# Patient Record
Sex: Male | Born: 1965 | Hispanic: No | Smoking: Current every day smoker
Health system: Southern US, Community
[De-identification: ages and names within clinical notes are randomized; demographics above are authoritative.]

## PROBLEM LIST (undated history)

## (undated) DIAGNOSIS — Z9989 Dependence on other enabling machines and devices: Secondary | ICD-10-CM

## (undated) DIAGNOSIS — M1990 Unspecified osteoarthritis, unspecified site: Secondary | ICD-10-CM

## (undated) DIAGNOSIS — K219 Gastro-esophageal reflux disease without esophagitis: Secondary | ICD-10-CM

## (undated) DIAGNOSIS — J45909 Unspecified asthma, uncomplicated: Secondary | ICD-10-CM

## (undated) DIAGNOSIS — E785 Hyperlipidemia, unspecified: Secondary | ICD-10-CM

## (undated) DIAGNOSIS — I10 Essential (primary) hypertension: Secondary | ICD-10-CM

## (undated) DIAGNOSIS — G4733 Obstructive sleep apnea (adult) (pediatric): Secondary | ICD-10-CM

## (undated) DIAGNOSIS — G8220 Paraplegia, unspecified: Secondary | ICD-10-CM

## (undated) DIAGNOSIS — F419 Anxiety disorder, unspecified: Secondary | ICD-10-CM

## (undated) HISTORY — PX: OTHER SURGICAL HISTORY: SHX169

## (undated) HISTORY — DX: Dependence on other enabling machines and devices: Z99.89

## (undated) HISTORY — PX: MULTIPLE TOOTH EXTRACTIONS: SHX2053

## (undated) HISTORY — DX: Hyperlipidemia, unspecified: E78.5

## (undated) HISTORY — PX: WISDOM TOOTH EXTRACTION: SHX21

## (undated) HISTORY — DX: Unspecified asthma, uncomplicated: J45.909

## (undated) HISTORY — DX: Unspecified osteoarthritis, unspecified site: M19.90

## (undated) HISTORY — PX: ABDOMINAL SURGERY: SHX537

## (undated) HISTORY — DX: Obstructive sleep apnea (adult) (pediatric): G47.33

## (undated) HISTORY — DX: Gastro-esophageal reflux disease without esophagitis: K21.9

## (undated) HISTORY — PX: TONSILLECTOMY: SUR1361

## (undated) HISTORY — DX: Anxiety disorder, unspecified: F41.9

---

## 1997-12-14 ENCOUNTER — Emergency Department (HOSPITAL_COMMUNITY): Admission: EM | Admit: 1997-12-14 | Discharge: 1997-12-14 | Payer: Self-pay | Admitting: Emergency Medicine

## 1998-01-04 ENCOUNTER — Emergency Department (HOSPITAL_COMMUNITY): Admission: EM | Admit: 1998-01-04 | Discharge: 1998-01-04 | Payer: Self-pay | Admitting: Cardiology

## 1998-02-14 ENCOUNTER — Emergency Department (HOSPITAL_COMMUNITY): Admission: EM | Admit: 1998-02-14 | Discharge: 1998-02-14 | Payer: Self-pay | Admitting: Emergency Medicine

## 1998-09-17 ENCOUNTER — Encounter: Payer: Self-pay | Admitting: Emergency Medicine

## 1998-09-17 ENCOUNTER — Emergency Department (HOSPITAL_COMMUNITY): Admission: EM | Admit: 1998-09-17 | Discharge: 1998-09-17 | Payer: Self-pay | Admitting: Emergency Medicine

## 1998-09-24 ENCOUNTER — Emergency Department (HOSPITAL_COMMUNITY): Admission: EM | Admit: 1998-09-24 | Discharge: 1998-09-24 | Payer: Self-pay | Admitting: Emergency Medicine

## 1998-11-12 ENCOUNTER — Inpatient Hospital Stay (HOSPITAL_COMMUNITY): Admission: EM | Admit: 1998-11-12 | Discharge: 1998-11-14 | Payer: Self-pay | Admitting: Emergency Medicine

## 1998-11-17 ENCOUNTER — Encounter: Admission: RE | Admit: 1998-11-17 | Discharge: 1998-11-17 | Payer: Self-pay | Admitting: Family Medicine

## 1999-04-12 ENCOUNTER — Emergency Department (HOSPITAL_COMMUNITY): Admission: EM | Admit: 1999-04-12 | Discharge: 1999-04-12 | Payer: Self-pay | Admitting: Emergency Medicine

## 1999-04-12 ENCOUNTER — Encounter: Payer: Self-pay | Admitting: Emergency Medicine

## 1999-05-03 ENCOUNTER — Inpatient Hospital Stay (HOSPITAL_COMMUNITY): Admission: EM | Admit: 1999-05-03 | Discharge: 1999-05-10 | Payer: Self-pay | Admitting: Emergency Medicine

## 1999-05-03 ENCOUNTER — Encounter: Payer: Self-pay | Admitting: Emergency Medicine

## 1999-05-10 ENCOUNTER — Emergency Department (HOSPITAL_COMMUNITY): Admission: EM | Admit: 1999-05-10 | Discharge: 1999-05-10 | Payer: Self-pay | Admitting: Emergency Medicine

## 1999-06-19 ENCOUNTER — Inpatient Hospital Stay (HOSPITAL_COMMUNITY): Admission: EM | Admit: 1999-06-19 | Discharge: 1999-06-21 | Payer: Self-pay | Admitting: Emergency Medicine

## 1999-06-19 ENCOUNTER — Encounter: Payer: Self-pay | Admitting: Emergency Medicine

## 1999-06-19 ENCOUNTER — Encounter: Payer: Self-pay | Admitting: Internal Medicine

## 1999-06-20 ENCOUNTER — Encounter: Payer: Self-pay | Admitting: Internal Medicine

## 1999-06-21 ENCOUNTER — Encounter: Payer: Self-pay | Admitting: *Deleted

## 2002-03-21 ENCOUNTER — Encounter: Payer: Self-pay | Admitting: Emergency Medicine

## 2002-03-21 ENCOUNTER — Emergency Department (HOSPITAL_COMMUNITY): Admission: EM | Admit: 2002-03-21 | Discharge: 2002-03-21 | Payer: Self-pay | Admitting: Emergency Medicine

## 2002-04-11 ENCOUNTER — Encounter: Payer: Self-pay | Admitting: Orthopedic Surgery

## 2002-04-11 ENCOUNTER — Ambulatory Visit (HOSPITAL_COMMUNITY): Admission: RE | Admit: 2002-04-11 | Discharge: 2002-04-11 | Payer: Self-pay | Admitting: Orthopedic Surgery

## 2003-07-05 ENCOUNTER — Emergency Department (HOSPITAL_COMMUNITY): Admission: EM | Admit: 2003-07-05 | Discharge: 2003-07-05 | Payer: Self-pay

## 2003-07-24 ENCOUNTER — Inpatient Hospital Stay (HOSPITAL_COMMUNITY): Admission: EM | Admit: 2003-07-24 | Discharge: 2003-07-29 | Payer: Self-pay | Admitting: Emergency Medicine

## 2005-05-06 ENCOUNTER — Inpatient Hospital Stay (HOSPITAL_COMMUNITY): Admission: EM | Admit: 2005-05-06 | Discharge: 2005-05-16 | Payer: Self-pay | Admitting: Emergency Medicine

## 2005-05-06 ENCOUNTER — Ambulatory Visit: Payer: Self-pay | Admitting: Physical Medicine & Rehabilitation

## 2005-05-16 ENCOUNTER — Emergency Department (HOSPITAL_COMMUNITY): Admission: EM | Admit: 2005-05-16 | Discharge: 2005-05-16 | Payer: Self-pay | Admitting: Emergency Medicine

## 2005-05-18 ENCOUNTER — Ambulatory Visit: Payer: Self-pay | Admitting: Family Medicine

## 2005-05-29 ENCOUNTER — Ambulatory Visit: Payer: Self-pay | Admitting: *Deleted

## 2005-06-07 ENCOUNTER — Ambulatory Visit: Payer: Self-pay | Admitting: Family Medicine

## 2005-07-05 ENCOUNTER — Ambulatory Visit: Payer: Self-pay | Admitting: Family Medicine

## 2005-07-20 ENCOUNTER — Ambulatory Visit: Payer: Self-pay | Admitting: Family Medicine

## 2005-08-01 ENCOUNTER — Encounter: Admission: RE | Admit: 2005-08-01 | Discharge: 2005-10-30 | Payer: Self-pay | Admitting: Family Medicine

## 2005-08-03 ENCOUNTER — Ambulatory Visit: Payer: Self-pay | Admitting: Family Medicine

## 2005-08-30 ENCOUNTER — Emergency Department (HOSPITAL_COMMUNITY): Admission: EM | Admit: 2005-08-30 | Discharge: 2005-08-31 | Payer: Self-pay | Admitting: Emergency Medicine

## 2005-09-02 ENCOUNTER — Ambulatory Visit: Payer: Self-pay | Admitting: Family Medicine

## 2005-09-15 ENCOUNTER — Ambulatory Visit: Payer: Self-pay | Admitting: Family Medicine

## 2005-10-28 ENCOUNTER — Ambulatory Visit: Payer: Self-pay | Admitting: Family Medicine

## 2005-11-09 ENCOUNTER — Ambulatory Visit: Payer: Self-pay | Admitting: Family Medicine

## 2005-12-14 ENCOUNTER — Ambulatory Visit: Payer: Self-pay | Admitting: Family Medicine

## 2005-12-16 ENCOUNTER — Ambulatory Visit: Payer: Self-pay | Admitting: Family Medicine

## 2005-12-20 ENCOUNTER — Emergency Department (HOSPITAL_COMMUNITY): Admission: EM | Admit: 2005-12-20 | Discharge: 2005-12-20 | Payer: Self-pay | Admitting: Emergency Medicine

## 2005-12-29 ENCOUNTER — Emergency Department (HOSPITAL_COMMUNITY): Admission: EM | Admit: 2005-12-29 | Discharge: 2005-12-29 | Payer: Self-pay | Admitting: Emergency Medicine

## 2006-01-02 ENCOUNTER — Ambulatory Visit: Payer: Self-pay | Admitting: Family Medicine

## 2006-02-09 ENCOUNTER — Ambulatory Visit: Payer: Self-pay | Admitting: Internal Medicine

## 2006-02-13 ENCOUNTER — Ambulatory Visit: Payer: Self-pay | Admitting: Family Medicine

## 2006-02-22 ENCOUNTER — Ambulatory Visit: Payer: Self-pay | Admitting: Family Medicine

## 2006-03-21 ENCOUNTER — Encounter: Admission: RE | Admit: 2006-03-21 | Discharge: 2006-06-19 | Payer: Self-pay | Admitting: Family Medicine

## 2006-04-26 ENCOUNTER — Ambulatory Visit: Payer: Self-pay | Admitting: Family Medicine

## 2006-04-27 ENCOUNTER — Emergency Department (HOSPITAL_COMMUNITY): Admission: EM | Admit: 2006-04-27 | Discharge: 2006-04-27 | Payer: Self-pay | Admitting: Emergency Medicine

## 2006-05-11 ENCOUNTER — Ambulatory Visit: Payer: Self-pay | Admitting: Family Medicine

## 2006-06-08 ENCOUNTER — Ambulatory Visit: Payer: Self-pay | Admitting: Family Medicine

## 2006-06-30 ENCOUNTER — Ambulatory Visit: Payer: Self-pay | Admitting: Family Medicine

## 2006-07-03 ENCOUNTER — Ambulatory Visit: Payer: Self-pay | Admitting: Internal Medicine

## 2006-07-05 ENCOUNTER — Inpatient Hospital Stay (HOSPITAL_COMMUNITY): Admission: EM | Admit: 2006-07-05 | Discharge: 2006-07-13 | Payer: Self-pay | Admitting: Emergency Medicine

## 2006-07-18 ENCOUNTER — Ambulatory Visit: Payer: Self-pay | Admitting: Family Medicine

## 2006-08-23 ENCOUNTER — Ambulatory Visit: Payer: Self-pay | Admitting: Family Medicine

## 2006-10-25 ENCOUNTER — Ambulatory Visit: Payer: Self-pay | Admitting: Family Medicine

## 2006-11-23 ENCOUNTER — Ambulatory Visit: Payer: Self-pay | Admitting: Family Medicine

## 2006-12-21 ENCOUNTER — Ambulatory Visit: Payer: Self-pay | Admitting: Family Medicine

## 2007-01-04 ENCOUNTER — Ambulatory Visit: Payer: Self-pay | Admitting: Family Medicine

## 2007-01-17 ENCOUNTER — Ambulatory Visit: Payer: Self-pay | Admitting: Family Medicine

## 2007-02-03 DIAGNOSIS — M6281 Muscle weakness (generalized): Secondary | ICD-10-CM | POA: Insufficient documentation

## 2007-02-03 DIAGNOSIS — Z87828 Personal history of other (healed) physical injury and trauma: Secondary | ICD-10-CM | POA: Insufficient documentation

## 2007-02-03 DIAGNOSIS — I10 Essential (primary) hypertension: Secondary | ICD-10-CM | POA: Insufficient documentation

## 2007-02-03 DIAGNOSIS — K219 Gastro-esophageal reflux disease without esophagitis: Secondary | ICD-10-CM | POA: Insufficient documentation

## 2007-02-03 DIAGNOSIS — B957 Other staphylococcus as the cause of diseases classified elsewhere: Secondary | ICD-10-CM | POA: Insufficient documentation

## 2007-02-14 DIAGNOSIS — F17210 Nicotine dependence, cigarettes, uncomplicated: Secondary | ICD-10-CM | POA: Insufficient documentation

## 2007-02-14 DIAGNOSIS — G894 Chronic pain syndrome: Secondary | ICD-10-CM | POA: Insufficient documentation

## 2007-02-14 DIAGNOSIS — F1910 Other psychoactive substance abuse, uncomplicated: Secondary | ICD-10-CM | POA: Insufficient documentation

## 2007-02-19 ENCOUNTER — Ambulatory Visit: Payer: Self-pay | Admitting: Family Medicine

## 2007-02-21 ENCOUNTER — Ambulatory Visit (HOSPITAL_COMMUNITY): Admission: RE | Admit: 2007-02-21 | Discharge: 2007-02-21 | Payer: Self-pay | Admitting: Family Medicine

## 2007-02-27 ENCOUNTER — Ambulatory Visit: Payer: Self-pay | Admitting: Family Medicine

## 2007-03-08 ENCOUNTER — Ambulatory Visit: Payer: Self-pay | Admitting: Family Medicine

## 2007-07-16 ENCOUNTER — Emergency Department (HOSPITAL_COMMUNITY): Admission: EM | Admit: 2007-07-16 | Discharge: 2007-07-16 | Payer: Self-pay | Admitting: Emergency Medicine

## 2007-12-17 ENCOUNTER — Ambulatory Visit: Payer: Self-pay | Admitting: Internal Medicine

## 2007-12-17 ENCOUNTER — Encounter (INDEPENDENT_AMBULATORY_CARE_PROVIDER_SITE_OTHER): Payer: Self-pay | Admitting: Family Medicine

## 2007-12-17 LAB — CONVERTED CEMR LAB
Amphetamine Screen, Ur: NEGATIVE
Barbiturate Quant, Ur: NEGATIVE
Benzodiazepines.: NEGATIVE
Cocaine Metabolites: POSITIVE — AB
Creatinine,U: 40.2 mg/dL
Marijuana Metabolite: NEGATIVE
Methadone: NEGATIVE
Opiate Screen, Urine: NEGATIVE
Phencyclidine (PCP): NEGATIVE
Propoxyphene: NEGATIVE

## 2007-12-25 ENCOUNTER — Ambulatory Visit: Payer: Self-pay | Admitting: Family Medicine

## 2008-03-20 ENCOUNTER — Ambulatory Visit: Payer: Self-pay | Admitting: Internal Medicine

## 2008-04-08 ENCOUNTER — Encounter: Admission: RE | Admit: 2008-04-08 | Discharge: 2008-07-07 | Payer: Self-pay | Admitting: Family Medicine

## 2008-05-08 ENCOUNTER — Ambulatory Visit: Payer: Self-pay | Admitting: Family Medicine

## 2008-07-04 ENCOUNTER — Ambulatory Visit: Payer: Self-pay | Admitting: Family Medicine

## 2009-03-10 ENCOUNTER — Emergency Department (HOSPITAL_COMMUNITY): Admission: EM | Admit: 2009-03-10 | Discharge: 2009-03-10 | Payer: Self-pay | Admitting: Emergency Medicine

## 2009-03-20 ENCOUNTER — Emergency Department (HOSPITAL_COMMUNITY): Admission: EM | Admit: 2009-03-20 | Discharge: 2009-03-20 | Payer: Self-pay | Admitting: Emergency Medicine

## 2009-03-30 ENCOUNTER — Emergency Department (HOSPITAL_COMMUNITY): Admission: EM | Admit: 2009-03-30 | Discharge: 2009-03-30 | Payer: Self-pay | Admitting: Emergency Medicine

## 2009-07-27 ENCOUNTER — Emergency Department (HOSPITAL_COMMUNITY): Admission: EM | Admit: 2009-07-27 | Discharge: 2009-07-27 | Payer: Self-pay | Admitting: Emergency Medicine

## 2009-07-28 ENCOUNTER — Ambulatory Visit: Payer: Self-pay | Admitting: Family Medicine

## 2009-07-28 LAB — CONVERTED CEMR LAB
ALT: 12 units/L (ref 0–53)
AST: 8 units/L (ref 0–37)
Albumin: 4.8 g/dL (ref 3.5–5.2)
Alkaline Phosphatase: 66 units/L (ref 39–117)
Amphetamine Screen, Ur: NEGATIVE
BUN: 14 mg/dL (ref 6–23)
Barbiturate Quant, Ur: NEGATIVE
Basophils Absolute: 0 10*3/uL (ref 0.0–0.1)
Basophils Relative: 1 % (ref 0–1)
Benzodiazepines.: NEGATIVE
CO2: 19 meq/L (ref 19–32)
Calcium: 9.2 mg/dL (ref 8.4–10.5)
Chloride: 108 meq/L (ref 96–112)
Cocaine Metabolites: NEGATIVE
Creatinine, Ser: 1.02 mg/dL (ref 0.40–1.50)
Creatinine,U: 243.9 mg/dL
Eosinophils Absolute: 0 10*3/uL (ref 0.0–0.7)
Eosinophils Relative: 1 % (ref 0–5)
Glucose, Bld: 89 mg/dL (ref 70–99)
HCT: 42.4 % (ref 39.0–52.0)
Hemoglobin: 14.1 g/dL (ref 13.0–17.0)
Lymphocytes Relative: 34 % (ref 12–46)
Lymphs Abs: 1.6 10*3/uL (ref 0.7–4.0)
MCHC: 33.3 g/dL (ref 30.0–36.0)
MCV: 85.8 fL (ref 78.0–100.0)
Marijuana Metabolite: NEGATIVE
Methadone: NEGATIVE
Monocytes Absolute: 0.6 10*3/uL (ref 0.1–1.0)
Monocytes Relative: 13 % — ABNORMAL HIGH (ref 3–12)
Neutro Abs: 2.4 10*3/uL (ref 1.7–7.7)
Neutrophils Relative %: 52 % (ref 43–77)
Opiate Screen, Urine: POSITIVE — AB
Phencyclidine (PCP): NEGATIVE
Platelets: 200 10*3/uL (ref 150–400)
Potassium: 3.9 meq/L (ref 3.5–5.3)
Propoxyphene: NEGATIVE
RBC: 4.94 M/uL (ref 4.22–5.81)
RDW: 15.6 % — ABNORMAL HIGH (ref 11.5–15.5)
Sodium: 141 meq/L (ref 135–145)
T3 Uptake Ratio: 39.3 % — ABNORMAL HIGH (ref 22.5–37.0)
T4, Total: 7.6 ug/dL (ref 5.0–12.5)
TSH: 1.503 microintl units/mL (ref 0.350–4.500)
Total Bilirubin: 0.4 mg/dL (ref 0.3–1.2)
Total Protein: 7.4 g/dL (ref 6.0–8.3)
Vit D, 25-Hydroxy: 9 ng/mL — ABNORMAL LOW (ref 30–89)
WBC: 4.7 10*3/uL (ref 4.0–10.5)

## 2009-08-18 ENCOUNTER — Emergency Department (HOSPITAL_COMMUNITY): Admission: EM | Admit: 2009-08-18 | Discharge: 2009-08-18 | Payer: Self-pay | Admitting: Emergency Medicine

## 2009-08-25 ENCOUNTER — Ambulatory Visit: Payer: Self-pay | Admitting: Family Medicine

## 2009-09-17 ENCOUNTER — Ambulatory Visit: Payer: Self-pay | Admitting: Family Medicine

## 2009-09-17 LAB — CONVERTED CEMR LAB
Amphetamine Screen, Ur: NEGATIVE
Barbiturate Quant, Ur: NEGATIVE
Benzodiazepines.: NEGATIVE
Cocaine Metabolites: NEGATIVE
Creatinine,U: 180.1 mg/dL
Marijuana Metabolite: NEGATIVE
Methadone: NEGATIVE
Opiate Screen, Urine: NEGATIVE
Phencyclidine (PCP): NEGATIVE
Propoxyphene: NEGATIVE

## 2009-12-31 ENCOUNTER — Ambulatory Visit: Payer: Self-pay | Admitting: Family Medicine

## 2010-01-19 ENCOUNTER — Ambulatory Visit: Payer: Self-pay | Admitting: Internal Medicine

## 2010-01-19 ENCOUNTER — Other Ambulatory Visit: Admission: RE | Admit: 2010-01-19 | Discharge: 2010-01-19 | Payer: Self-pay | Admitting: Family Medicine

## 2010-01-20 ENCOUNTER — Ambulatory Visit: Payer: Self-pay | Admitting: Family Medicine

## 2010-01-20 LAB — CONVERTED CEMR LAB
Amphetamine Screen, Ur: NEGATIVE
Barbiturate Quant, Ur: NEGATIVE
Benzodiazepines.: NEGATIVE
Cocaine Metabolites: NEGATIVE
Creatinine,U: 100.4 mg/dL
Marijuana Metabolite: NEGATIVE
Methadone: NEGATIVE
Opiate Screen, Urine: NEGATIVE
Phencyclidine (PCP): NEGATIVE
Propoxyphene: NEGATIVE

## 2010-01-21 ENCOUNTER — Encounter (INDEPENDENT_AMBULATORY_CARE_PROVIDER_SITE_OTHER): Payer: Self-pay | Admitting: Family Medicine

## 2010-02-10 ENCOUNTER — Ambulatory Visit: Payer: Self-pay | Admitting: Family Medicine

## 2010-02-11 ENCOUNTER — Encounter (INDEPENDENT_AMBULATORY_CARE_PROVIDER_SITE_OTHER): Payer: Self-pay | Admitting: Family Medicine

## 2010-02-11 LAB — CONVERTED CEMR LAB
Amphetamine Screen, Ur: NEGATIVE
Barbiturate Quant, Ur: NEGATIVE
Benzodiazepines.: NEGATIVE
Cocaine Metabolites: POSITIVE — AB
Creatinine,U: 46.7 mg/dL
Marijuana Metabolite: NEGATIVE
Methadone: NEGATIVE
Opiate Screen, Urine: NEGATIVE
Phencyclidine (PCP): NEGATIVE
Propoxyphene: NEGATIVE

## 2010-08-30 ENCOUNTER — Emergency Department (HOSPITAL_COMMUNITY)
Admission: EM | Admit: 2010-08-30 | Discharge: 2010-08-30 | Payer: Self-pay | Source: Home / Self Care | Admitting: Emergency Medicine
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
## 2011-01-21 NOTE — Op Note (Signed)
NAME:  ZARIF, RATHJE                            ACCOUNT NO.:  [id number]   MEDICAL RECORD NO.:  [id number]                   PATIENT TYPE:  INP   LOCATION:  5704                                 FACILITY:  [facility]   PHYSICIAN:  Gabrielle Dare. Janee Morn, M.D.             DATE OF BIRTH:  1966/06/23   DATE OF PROCEDURE:  07/28/2003  DATE OF DISCHARGE:                                 OPERATIVE REPORT   PREOPERATIVE DIAGNOSIS:  Cellulitis with abscess left anterior lower  extremity.   POSTOPERATIVE DIAGNOSIS:  Cellulitis with abscess left anterior lower  extremity.   PROCEDURE:  Incision and drainage of abscess left anterior lower extremity.   SURGEON:  Gabrielle Dare. Janee Morn, M.D.   INDICATIONS FOR PROCEDURE:  The patient is a 45 year old African-American  male who is admitted with cellulitis of left lower extremity.  He had  worsening fluctuance in the area and is taken to the operating room for  operative incision and drainage.   DESCRIPTION OF PROCEDURE:  Informed consent was obtained.  The patient was  on IV antibiotics.  The left anterior lower extremity was prepped and draped  in the usual sterile fashion.  A transverse incision  of 3 cm distally was  made through the subcutaneous tissue and abscess were entered and  approximately 100+ mL of brown pus were evacuated with some chunky material.  Debris was irrigated out and once the cavity was sufficiently evacuated, the  wound was packed with half inch Iodoform gauze.  The patient tolerated the  procedure well.  Please note cultures were sent __________ cavity.                                               Gabrielle Dare Janee Morn, M.D.    BET/MEDQ  D:  07/28/2003  T:  07/29/2003  Job:  [id number]
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
## 2011-08-28 ENCOUNTER — Emergency Department (HOSPITAL_COMMUNITY): Payer: Medicaid Other

## 2011-08-28 ENCOUNTER — Encounter: Payer: Self-pay | Admitting: *Deleted

## 2011-08-28 ENCOUNTER — Emergency Department (HOSPITAL_COMMUNITY)
Admission: EM | Admit: 2011-08-28 | Discharge: 2011-08-28 | Disposition: A | Discharge status: Home / Self Care | Payer: Medicaid Other | Attending: Emergency Medicine | Admitting: Emergency Medicine

## 2011-08-28 DIAGNOSIS — M25529 Pain in unspecified elbow: Secondary | ICD-10-CM | POA: Insufficient documentation

## 2011-08-28 DIAGNOSIS — T07XXXA Unspecified multiple injuries, initial encounter: Secondary | ICD-10-CM

## 2011-08-28 DIAGNOSIS — G8220 Paraplegia, unspecified: Secondary | ICD-10-CM | POA: Insufficient documentation

## 2011-08-28 DIAGNOSIS — M25519 Pain in unspecified shoulder: Secondary | ICD-10-CM | POA: Insufficient documentation

## 2011-08-28 DIAGNOSIS — M542 Cervicalgia: Secondary | ICD-10-CM | POA: Insufficient documentation

## 2011-08-28 DIAGNOSIS — M79609 Pain in unspecified limb: Secondary | ICD-10-CM | POA: Insufficient documentation

## 2011-08-28 DIAGNOSIS — F17200 Nicotine dependence, unspecified, uncomplicated: Secondary | ICD-10-CM | POA: Insufficient documentation

## 2011-08-28 DIAGNOSIS — IMO0002 Reserved for concepts with insufficient information to code with codable children: Secondary | ICD-10-CM | POA: Insufficient documentation

## 2011-08-28 HISTORY — DX: Paraplegia, unspecified: G82.20

## 2011-08-28 MED ORDER — OXYCODONE-ACETAMINOPHEN 5-325 MG PO TABS
2.0000 | ORAL_TABLET | Freq: Once | ORAL | Status: AC
  Administered 2011-08-28: 2 via ORAL
  Filled 2011-08-28: qty 2

## 2011-08-28 NOTE — ED Notes (Signed)
MD at bedside. EDP Campos at bedside, Multimedia programmer and this RN at bedside to assist w/log roll and to remove LSB

## 2011-08-28 NOTE — ED Notes (Signed)
Pt refused dc VS and refused to sign dc paperwork
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
## 2011-08-28 NOTE — ED Notes (Signed)
Pt states ride is on the way- [facility] cancelled
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
## 2011-08-28 NOTE — ED Notes (Signed)
[facility] contacted to transport pt back to home [address] [place] [address] -  per nurses request

## 2013-02-13 ENCOUNTER — Emergency Department (HOSPITAL_COMMUNITY)
Admission: EM | Admit: 2013-02-13 | Discharge: 2013-02-13 | Disposition: A | Discharge status: Home / Self Care | Payer: Medicaid Other | Attending: Emergency Medicine | Admitting: Emergency Medicine

## 2013-02-13 ENCOUNTER — Encounter (HOSPITAL_COMMUNITY): Payer: Self-pay | Admitting: Internal Medicine

## 2013-02-13 DIAGNOSIS — G8929 Other chronic pain: Secondary | ICD-10-CM | POA: Insufficient documentation

## 2013-02-13 DIAGNOSIS — M25561 Pain in right knee: Secondary | ICD-10-CM

## 2013-02-13 DIAGNOSIS — T494X1A Poisoning by keratolytics, keratoplastics, and other hair treatment drugs and preparations, accidental (unintentional), initial encounter: Secondary | ICD-10-CM

## 2013-02-13 DIAGNOSIS — Z993 Dependence on wheelchair: Secondary | ICD-10-CM | POA: Insufficient documentation

## 2013-02-13 DIAGNOSIS — Z79899 Other long term (current) drug therapy: Secondary | ICD-10-CM | POA: Insufficient documentation

## 2013-02-13 DIAGNOSIS — T3795XA Adverse effect of unspecified systemic anti-infective and antiparasitic, initial encounter: Secondary | ICD-10-CM | POA: Insufficient documentation

## 2013-02-13 DIAGNOSIS — I10 Essential (primary) hypertension: Secondary | ICD-10-CM | POA: Insufficient documentation

## 2013-02-13 DIAGNOSIS — F17200 Nicotine dependence, unspecified, uncomplicated: Secondary | ICD-10-CM | POA: Insufficient documentation

## 2013-02-13 DIAGNOSIS — M25569 Pain in unspecified knee: Secondary | ICD-10-CM | POA: Insufficient documentation

## 2013-02-13 LAB — COMPREHENSIVE METABOLIC PANEL
ALT: 15 U/L (ref 0–53)
AST: 6 U/L (ref 0–37)
Albumin: 3.9 g/dL (ref 3.5–5.2)
Alkaline Phosphatase: 70 U/L (ref 39–117)
BUN: 9 mg/dL (ref 6–23)
Chloride: 105 mEq/L (ref 96–112)
Potassium: 4.2 mEq/L (ref 3.5–5.1)
Sodium: 142 mEq/L (ref 135–145)
Total Bilirubin: 0.2 mg/dL — ABNORMAL LOW (ref 0.3–1.2)
Total Protein: 7.3 g/dL (ref 6.0–8.3)

## 2013-02-13 LAB — CBC WITH DIFFERENTIAL/PLATELET
Basophils Relative: 0 % (ref 0–1)
Hemoglobin: 15 g/dL (ref 13.0–17.0)
Lymphs Abs: 2.3 10*3/uL (ref 0.7–4.0)
MCHC: 33.6 g/dL (ref 30.0–36.0)
Monocytes Relative: 9 % (ref 3–12)
Neutro Abs: 5.5 10*3/uL (ref 1.7–7.7)
Neutrophils Relative %: 64 % (ref 43–77)
Platelets: 214 10*3/uL (ref 150–400)
RBC: 5.25 MIL/uL (ref 4.22–5.81)

## 2013-02-13 LAB — POCT I-STAT, CHEM 8
BUN: 8 mg/dL (ref 6–23)
Calcium, Ion: 1.19 mmol/L (ref 1.12–1.23)
Chloride: 105 mEq/L (ref 96–112)
Creatinine, Ser: 0.9 mg/dL (ref 0.50–1.35)
Glucose, Bld: 105 mg/dL — ABNORMAL HIGH (ref 70–99)
TCO2: 30 mmol/L (ref 0–100)

## 2013-02-13 LAB — ACETAMINOPHEN LEVEL: Acetaminophen (Tylenol), Serum: <15 ug/mL (ref 10–30)

## 2013-02-13 LAB — SALICYLATE LEVEL: Salicylate Lvl: <2 mg/dL — ABNORMAL LOW (ref 2.8–20.0)

## 2013-02-13 MED ORDER — TRAMADOL HCL 50 MG PO TABS: 50.0000 mg | ORAL_TABLET | Freq: Four times a day (QID) | ORAL | Status: DC | PRN

## 2013-02-13 MED ORDER — TRAMADOL HCL 50 MG PO TABS: 50.0000 mg | ORAL_TABLET | Freq: Three times a day (TID) | ORAL | Status: DC | PRN

## 2013-02-13 NOTE — ED Notes (Signed)
Pt. Stated, I have bilateral knee pain, had them both drained last week.

## 2013-02-13 NOTE — ED Notes (Signed)
Pt. Stated, I'm out of all my medications, I've been out for a month, that's why my BP is up. I need something for pain and all the rest of my medications.

## 2013-02-13 NOTE — ED Notes (Signed)
Case manager spoke with patient and gave resources to obtain knee braces.

## 2013-02-13 NOTE — Progress Notes (Signed)
02/13/13-1133-J.Salwa Bai,RN,BSN [phone]      In to see patient. Reports having received a prescription for knee braces from [employer]. Patient has medicaid and is unaware of where to go to receive knee braces. [facility] contacted by [facility] at [phone]. Informed that patient can report to [facility] directly after discharge from ED. Map and directions from [facility] to [employer] off and given to patient. No other needs identified at this time. Patient being discharged to self care.

## 2013-02-13 NOTE — ED Provider Notes (Signed)
Pt with hx of frequent knee pain, recent arthrocentesis based on pt reports.  Today the knee joints are supple and no redness or fevers, pt admits to taking large amount of ASA over the last few days but on labaratory w/u has no acidosis and undetectable ASA levels.  Pt informed of need to not use ASA in inappropriate manner.  Pt stable for d/c.  I have personally interpreted and agree with interpretation of ECG as documnted by resident.  I saw and evaluated the patient, reviewed the resident's note and I agree with the findings and plan.   Vida Roller, MD 02/13/13 2002

## 2013-02-13 NOTE — ED Notes (Signed)
Given [place] sandwich and soda to patient.

## 2013-02-13 NOTE — ED Provider Notes (Signed)
History     CSN: [id number]  Arrival date & time 02/13/13  0820   First MD Initiated Contact with Patient 02/13/13 0830     Chief Complaint  Patient presents with  . Knee Pain    b/l  . Drug Overdose    Aspirin   (Consider location/radiation/quality/duration/timing/severity/associated sxs/prior treatment) HPI Comments: Jesse Collins is a 47 year old African American male with PMH of uncontrolled HTN, knee pain uses wheelchair, and polysubstance abuse presenting to the ED today with complaints of worsening b/l knee pain.  He explains that he was recently seen by Dr. Ranell Patrick at [facility] on June 5th 2014 and had b/l knee aspiration and injections along with xray's.  He was given a prescription for knee braces at that time that he is unable to get so far due to insurance issues.  He was not given any pain medication on that visit and previously has been using left over percocet at home that he had from a prior prescription from April 2014, last dose was taken approximately 2 weeks prior.  He claims he felt better in regards to knee pain for approximately 2 days after seeing Dr. Ranell Patrick, but the pain has slowly returned, limiting his mobility even more and swelling returning more on his left knee.  This morning, his pain was so severe that he could not take it anymore and decided to come to the emergency room.  Currently his pain is 8/10 bilaterally, more pain on medial aspect of left knee, worse with movement, no alleviating factors.  He reports feeling hot occasionally at home in the past with possible fever but has not checked his temperature.    Of note, he does endorse finishing a full bottle of 60 tablets of baby asprin at home over the course of 2 days for pain with no relief.  He last aspirin was 2 days prior.  He is allergic to Ibuprofen.   Patient is a 47 y.o. male presenting with knee pain. The history is provided by the patient. No language interpreter was used.  Knee  Pain Location:  Knee (b/l) Time since incident:  1 week Injury: no   Knee location:  L knee and R knee Pain details:    Quality:  Burning, sharp and aching   Radiates to:  Does not radiate   Severity:  Moderate   Onset quality:  Gradual   Duration:  1 week   Timing:  Constant   Progression:  Worsening Chronicity:  Chronic Dislocation: no   Foreign body present:  No foreign bodies Tetanus status:  Unknown Prior injury to area:  Yes Relieved by: narcotics. Worsened by:  Activity Ineffective treatments: aspirin. Associated symptoms: decreased ROM, muscle weakness, stiffness and swelling   Associated symptoms: no numbness   Risk factors: obesity    Past Medical History  Diagnosis Date  . Paralysis of both lower limbs     Past Surgical History  Procedure Laterality Date  . Abdominal surgery    . Arm surger      bilateral arm surgery   Family History  Problem Relation Age of Onset  . Arthritis Mother   . Arthritis Father   . Heart disease Father   . Stroke Other    History  Substance Use Topics  . Smoking status: Current Every Day Smoker -- 1.00 packs/day    Types: Cigarettes  . Smokeless tobacco: Not on file  . Alcohol Use: No     Comment: Former  Review of Systems  Constitutional: Negative.   HENT: Negative.   Eyes: Negative.   Respiratory: Negative.   Cardiovascular: Negative.   Gastrointestinal: Negative.   Endocrine: Negative.   Genitourinary: Negative.   Musculoskeletal: Positive for joint swelling, gait problem and stiffness.       Left knee>right  Skin: Negative.   Neurological: Negative.   Hematological: Negative.   Psychiatric/Behavioral: Negative.    Allergies  Ibuprofen  Home Medications   Current Outpatient Rx  Name  Route  Sig  Dispense  Refill  . ALPRAZolam (XANAX) 1 MG tablet   Oral   Take 1 mg by mouth at bedtime as needed for sleep.         Marland Kitchen aspirin 325 MG tablet   Oral   Take 325 mg by mouth every 6 (six) hours as  needed. For pain          . gabapentin (NEURONTIN) 300 MG capsule   Oral   Take 300 mg by mouth 2 (two) times daily.           Marland Kitchen oxyCODONE-acetaminophen (PERCOCET) 10-325 MG per tablet   Oral   Take 1-2 tablets by mouth every 6 (six) hours as needed for pain.           BP 141/108  Pulse 89  Temp(Src) 98.3 F (36.8 C) (Oral)  Resp 20  SpO2 97%  Physical Exam  Constitutional: He is oriented to person, place, and time. He appears well-nourished.  HENT:  Head: Normocephalic and atraumatic.  Eyes: EOM are normal. Pupils are equal, round, and reactive to light.  Neck: Normal range of motion.  Cardiovascular: Normal heart sounds and intact distal pulses.   tachycardia  Pulmonary/Chest: Effort normal and breath sounds normal.  Abdominal: He exhibits distension. There is no tenderness.  Musculoskeletal: He exhibits edema. He exhibits no tenderness.  Limited ROM of b/l lower extremities due to complaints of pain. + left knee edema>right  Neurological: He is alert and oriented to person, place, and time.  Strength 4/5 RLE 3/5 LLE  Skin: Skin is warm and dry. He is not diaphoretic.  Psychiatric: He has a normal mood and affect. His behavior is normal.   ED Course  Procedures (including critical care time)  Labs Reviewed  SALICYLATE LEVEL - Abnormal; Notable for the following:    Salicylate Lvl <2.0 (*)    All other components within normal limits  COMPREHENSIVE METABOLIC PANEL - Abnormal; Notable for the following:    Glucose, Bld 104 (*)    Total Bilirubin 0.2 (*)    All other components within normal limits  POCT I-STAT, CHEM 8 - Abnormal; Notable for the following:    Glucose, Bld 105 (*)    All other components within normal limits  CBC WITH DIFFERENTIAL  ACETAMINOPHEN LEVEL   No results found.   Date: 02/13/2013  Rate: 82  Rhythm: normal sinus rhythm  QRS Axis: normal  Intervals: normal  ST/T Wave abnormalities: inverted t waves in avL and V1  Conduction  Disutrbances:none  Narrative Interpretation: 82 bpm NSR, slightly tall p waves and inverted p waves in avL and v1 v2 with non-specific t wave changes  Old EKG Reviewed: reviewed 07/04/2006: 95bpm, NSR, with artifact  1. Knee pain, bilateral   2. Accidental anthralin overdose, initial encounter     MDM  Jesse Collins is a 47 year old male with PMH of HTN and knee pain presenting with worsening b/l knee pain s/p b/l aspiration 02/07/2013. Reports  taking a full bottle of aspirin 2 days prior, at least 60 tablets with unknown dose.  Concern for accidental aspirin overdose.  Chronic knee pain.  No metabolic abnormalities per labs, AG 8, Salicylate level <2 and Acetaminophen level <15.  Counseled extensively to avoid excessive Asprin use and discuss pain control with primary care provider and orthopaedics.  -EKG -CBC Hb 15 -CMET glucose 104, TB 0.2, AG 8 -Istat chem 8 -acetaminophen level <15 -salicylate level <2 -tramadol limited supply prn for pain, will need to follow up with PCP and Orthopaedic office -discussed with case management to provide information on how to get prescription filled for knee braces with medicaid; they will discuss with patient -d/c home  Case discussed with Dr. Jessee Avers, MD 02/13/13 1136

## 2014-06-09 ENCOUNTER — Other Ambulatory Visit: Payer: Self-pay | Admitting: Family Medicine

## 2016-12-30 ENCOUNTER — Other Ambulatory Visit: Payer: Self-pay | Admitting: General Surgery

## 2016-12-30 DIAGNOSIS — K432 Incisional hernia without obstruction or gangrene: Secondary | ICD-10-CM

## 2017-01-05 ENCOUNTER — Ambulatory Visit
Admission: RE | Admit: 2017-01-05 | Discharge: 2017-01-05 | Disposition: A | Discharge status: Home / Self Care | Payer: Medicaid Other | Source: Ambulatory Visit | Attending: General Surgery | Admitting: General Surgery

## 2017-01-05 DIAGNOSIS — K432 Incisional hernia without obstruction or gangrene: Secondary | ICD-10-CM

## 2017-01-05 MED ORDER — IOPAMIDOL (ISOVUE-300) INJECTION 61%
100.0000 mL | Freq: Once | INTRAVENOUS | Status: AC | PRN
  Administered 2017-01-05: 100 mL via INTRAVENOUS

## 2017-04-25 ENCOUNTER — Encounter (HOSPITAL_COMMUNITY): Payer: Self-pay | Admitting: *Deleted

## 2017-04-25 DIAGNOSIS — Z5321 Procedure and treatment not carried out due to patient leaving prior to being seen by health care provider: Secondary | ICD-10-CM | POA: Diagnosis not present

## 2017-04-25 DIAGNOSIS — M25511 Pain in right shoulder: Secondary | ICD-10-CM | POA: Insufficient documentation

## 2017-04-25 DIAGNOSIS — M25512 Pain in left shoulder: Secondary | ICD-10-CM | POA: Insufficient documentation

## 2017-04-25 DIAGNOSIS — M542 Cervicalgia: Secondary | ICD-10-CM | POA: Insufficient documentation

## 2017-04-25 DIAGNOSIS — M545 Low back pain: Secondary | ICD-10-CM | POA: Diagnosis present
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
## 2017-04-26 ENCOUNTER — Emergency Department (HOSPITAL_COMMUNITY)
Admission: EM | Admit: 2017-04-26 | Discharge: 2017-04-26 | Disposition: A | Discharge status: Home / Self Care | Payer: Medicaid Other | Attending: Emergency Medicine | Admitting: Emergency Medicine

## 2017-04-26 ENCOUNTER — Emergency Department (HOSPITAL_COMMUNITY): Payer: Medicaid Other

## 2017-04-26 ENCOUNTER — Encounter (HOSPITAL_COMMUNITY): Payer: Self-pay | Admitting: Emergency Medicine

## 2017-04-26 DIAGNOSIS — Y9389 Activity, other specified: Secondary | ICD-10-CM | POA: Diagnosis not present

## 2017-04-26 DIAGNOSIS — Y929 Unspecified place or not applicable: Secondary | ICD-10-CM | POA: Diagnosis not present

## 2017-04-26 DIAGNOSIS — Z7982 Long term (current) use of aspirin: Secondary | ICD-10-CM | POA: Diagnosis not present

## 2017-04-26 DIAGNOSIS — S161XXA Strain of muscle, fascia and tendon at neck level, initial encounter: Secondary | ICD-10-CM | POA: Diagnosis not present

## 2017-04-26 DIAGNOSIS — M545 Low back pain, unspecified: Secondary | ICD-10-CM

## 2017-04-26 DIAGNOSIS — S199XXA Unspecified injury of neck, initial encounter: Secondary | ICD-10-CM | POA: Diagnosis present

## 2017-04-26 DIAGNOSIS — F17210 Nicotine dependence, cigarettes, uncomplicated: Secondary | ICD-10-CM | POA: Diagnosis not present

## 2017-04-26 DIAGNOSIS — Y999 Unspecified external cause status: Secondary | ICD-10-CM | POA: Diagnosis not present

## 2017-04-26 DIAGNOSIS — Z79899 Other long term (current) drug therapy: Secondary | ICD-10-CM | POA: Insufficient documentation

## 2017-04-26 DIAGNOSIS — I10 Essential (primary) hypertension: Secondary | ICD-10-CM | POA: Insufficient documentation

## 2017-04-26 HISTORY — DX: Essential (primary) hypertension: I10

## 2017-04-26 MED ORDER — METHOCARBAMOL 500 MG PO TABS
500.0000 mg | ORAL_TABLET | Freq: Once | ORAL | Status: AC
  Administered 2017-04-26: 500 mg via ORAL
  Filled 2017-04-26: qty 1

## 2017-04-26 MED ORDER — METHOCARBAMOL 500 MG PO TABS: 500.0000 mg | ORAL_TABLET | Freq: Two times a day (BID) | ORAL | 0 refills | Status: DC

## 2017-04-26 NOTE — ED Notes (Signed)
Back from xray

## 2017-04-26 NOTE — ED Notes (Signed)
Pt called to reassess vitals. 

## 2017-04-26 NOTE — ED Notes (Signed)
Transported to X-ray

## 2017-04-26 NOTE — ED Notes (Signed)
Taken to radiology from waiting room.

## 2017-04-26 NOTE — Discharge Instructions (Signed)
1. Medications: robaxin, tylenol, usual home medications 2. Treatment: rest, drink plenty of fluids, gentle stretching as discussed, alternate ice and heat 3. Follow Up: Please followup with your primary doctor in 3 days for discussion of your diagnoses and further evaluation after today's visit; if you do not have a primary care doctor use the resource guide provided to find one;  Return to the ER for worsening back pain, difficulty walking, loss of bowel or bladder control or other concerning symptoms     
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
## 2017-04-26 NOTE — ED Notes (Signed)
Unable to get signature due to malfunctioning sig pad.

## 2018-03-02 ENCOUNTER — Encounter: Payer: Self-pay | Admitting: Gastroenterology

## 2018-03-09 ENCOUNTER — Telehealth: Payer: Self-pay | Admitting: *Deleted
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
## 2018-03-12 ENCOUNTER — Encounter: Payer: Self-pay | Admitting: Nurse Practitioner

## 2018-03-22 ENCOUNTER — Ambulatory Visit (AMBULATORY_SURGERY_CENTER): Payer: Self-pay

## 2018-03-22 ENCOUNTER — Other Ambulatory Visit: Payer: Self-pay

## 2018-03-22 ENCOUNTER — Encounter: Payer: Self-pay | Admitting: Gastroenterology

## 2018-03-22 VITALS — Ht 70.0 in | Wt 264.0 lb

## 2018-03-22 DIAGNOSIS — Z1211 Encounter for screening for malignant neoplasm of colon: Secondary | ICD-10-CM

## 2018-03-22 DIAGNOSIS — Z800 Family history of malignant neoplasm of digestive organs: Secondary | ICD-10-CM

## 2018-03-22 MED ORDER — SOD PICOSULFATE-MAG OX-CIT ACD 10-3.5-12 MG-GM -GM/160ML PO SOLN: 1.0000 | Freq: Once | ORAL | 0 refills | Status: AC

## 2018-03-22 NOTE — Progress Notes (Signed)
No egg or soy allergy known to patient  No issues with past sedation with any surgeries  or procedures, no intubation problems  No diet pills per patient No home 02 use per patient  No blood thinners per patient  Pt denies issues with constipation  No A fib or A flutter  EMMI video sent to pt's e mail  

## 2018-03-26 ENCOUNTER — Telehealth: Payer: Self-pay | Admitting: Gastroenterology

## 2018-03-26 MED ORDER — SOD PICOSULFATE-MAG OX-CIT ACD 10-3.5-12 MG-GM -GM/160ML PO SOLN: 1.0000 | ORAL | 0 refills | Status: DC

## 2018-03-26 NOTE — Telephone Encounter (Signed)
Initial script was sent to [facility] - [facility]'s request- called pt and informed it was sent to correct pharmacy Hilda LiasMarie PV

## 2018-04-04 ENCOUNTER — Encounter: Payer: Self-pay | Admitting: Gastroenterology

## 2018-04-26 ENCOUNTER — Telehealth: Payer: Self-pay

## 2018-04-27 ENCOUNTER — Ambulatory Visit (AMBULATORY_SURGERY_CENTER): Payer: Self-pay

## 2018-04-27 VITALS — Ht 71.0 in | Wt 257.2 lb

## 2018-04-27 DIAGNOSIS — Z800 Family history of malignant neoplasm of digestive organs: Secondary | ICD-10-CM

## 2018-04-27 NOTE — Progress Notes (Signed)
Per pt, no allergies to soy or egg products.Pt not taking any weight loss meds or using  O2 at home.  Pt refused emmi video. 

## 2018-04-30 NOTE — Telephone Encounter (Signed)
Pt was a no show for previsit, and called and reschedule a previsit for the following day.

## 2018-05-10 ENCOUNTER — Ambulatory Visit (AMBULATORY_SURGERY_CENTER): Payer: Medicaid Other | Admitting: Gastroenterology

## 2018-05-10 ENCOUNTER — Telehealth: Payer: Self-pay | Admitting: Gastroenterology

## 2018-05-10 ENCOUNTER — Encounter: Payer: Self-pay | Admitting: Gastroenterology

## 2018-05-10 VITALS — BP 96/56 | HR 95 | Temp 98.7°F | Resp 18 | Ht 71.0 in | Wt 257.0 lb

## 2018-05-10 DIAGNOSIS — Z1211 Encounter for screening for malignant neoplasm of colon: Secondary | ICD-10-CM | POA: Diagnosis not present

## 2018-05-10 DIAGNOSIS — Z800 Family history of malignant neoplasm of digestive organs: Secondary | ICD-10-CM | POA: Diagnosis not present

## 2018-05-10 HISTORY — PX: COLONOSCOPY: SHX174

## 2018-05-10 MED ORDER — SOD PICOSULFATE-MAG OX-CIT ACD 10-3.5-12 MG-GM -GM/160ML PO SOLN: 1.0000 | ORAL | 0 refills | Status: DC

## 2018-05-10 MED ORDER — SODIUM CHLORIDE 0.9 % IV SOLN: 500.0000 mL | Freq: Once | INTRAVENOUS | Status: DC

## 2018-05-10 NOTE — Patient Instructions (Signed)
Resume previous diet. Continue present medications.     YOU HAD AN ENDOSCOPIC PROCEDURE TODAY AT THE [facility]:   Refer to the procedure report that was given to you for any specific questions about what was found during the examination.  If the procedure report does not answer your questions, please call your gastroenterologist to clarify.  If you requested that your care partner not be given the details of your procedure findings, then the procedure report has been included in a sealed envelope for you to review at your convenience later.  YOU SHOULD EXPECT: Some feelings of bloating in the abdomen. Passage of more gas than usual.  Walking can help get rid of the air that was put into your GI tract during the procedure and reduce the bloating. If you had a lower endoscopy (such as a colonoscopy or flexible sigmoidoscopy) you may notice spotting of blood in your stool or on the toilet paper. If you underwent a bowel prep for your procedure, you may not have a normal bowel movement for a few days.  Please Note:  You might notice some irritation and congestion in your nose or some drainage.  This is from the oxygen used during your procedure.  There is no need for concern and it should clear up in a day or so.  SYMPTOMS TO REPORT IMMEDIATELY:   Following lower endoscopy (colonoscopy or flexible sigmoidoscopy):  Excessive amounts of blood in the stool  Significant tenderness or worsening of abdominal pains  Swelling of the abdomen that is new, acute  Fever of 100F or higher    For urgent or emergent issues, a gastroenterologist can be reached at any hour by calling [phone].   DIET:  We do recommend a small meal at first, but then you may proceed to your regular diet.  Drink plenty of fluids but you should avoid alcoholic beverages for 24 hours.  ACTIVITY:  You should plan to take it easy for the rest of today and you should NOT DRIVE or use heavy machinery until  tomorrow (because of the sedation medicines used during the test).    FOLLOW UP: Our staff will call the number listed on your records the next business day following your procedure to check on you and address any questions or concerns that you may have regarding the information given to you following your procedure. If we do not reach you, we will leave a message.  However, if you are feeling well and you are not experiencing any problems, there is no need to return our call.  We will assume that you have returned to your regular daily activities without incident.  If any biopsies were taken you will be contacted by phone or by letter within the next 1-3 weeks.  Please call us at [phone] if you have not heard about the biopsies in 3 weeks.    SIGNATURES/CONFIDENTIALITY: You and/or your care partner have signed paperwork which will be entered into your electronic medical record.  These signatures attest to the fact that that the information above on your After Visit Summary has been reviewed and is understood.  Full responsibility of the confidentiality of this discharge information lies with you and/or your care-partner.

## 2018-05-10 NOTE — Progress Notes (Signed)
Pt's states no medical or surgical changes since previsit or office visit. 

## 2018-05-10 NOTE — Progress Notes (Signed)
Report to PACU, RN, vss, BBS= Clear.  

## 2018-05-10 NOTE — Telephone Encounter (Signed)
Script sent to pharmacy.

## 2018-05-10 NOTE — Telephone Encounter (Signed)
Pt needs to be scheduled for another previsit to go over the instructions for a 2 day prep and receive prep prescriptions.

## 2018-05-10 NOTE — Op Note (Signed)
[facility] Patient Name: Jesse Collins Procedure Date: 05/10/2018 9:42 AM MRN: [id number] Endoscopist: Lynann Bologna , MD Age: 52 Referring MD:  Date of Birth: 02-Apr-1966 Gender: Male Account #: [id number] Procedure:                Colonoscopy Indications:              Screening in patient at increased risk: Colorectal                            cancer in father before age 10 Medicines:                Monitored Anesthesia Care Procedure:                Pre-Anesthesia Assessment:                           - Prior to the procedure, a History and Physical                            was performed, and patient medications and                            allergies were reviewed. The patient's tolerance of                            previous anesthesia was also reviewed. The risks                            and benefits of the procedure and the sedation                            options and risks were discussed with the patient.                            All questions were answered, and informed consent                            was obtained. Prior Anticoagulants: The patient has                            taken aspirin, last dose was 5 days prior to                            procedure. ASA Grade Assessment: III - A patient                            with severe systemic disease. After reviewing the                            risks and benefits, the patient was deemed in                            satisfactory condition to undergo the procedure.  After obtaining informed consent, the colonoscope                            was passed under direct vision. Throughout the                            procedure, the patient's blood pressure, pulse, and                            oxygen saturations were monitored continuously. The                            Colonoscope was introduced through the anus with                            the intention of advancing to the  cecum. The scope                            was advanced to the sigmoid colon before the                            procedure was aborted. Medications were given. The                            colonoscopy was performed without difficulty. The                            patient tolerated the procedure well. The quality                            of the bowel preparation was unsatisfactory. The                            colonoscopy was aborted due to poor bowel prep with                            stool present. Scope In: 9:49:42 AM Scope Out: 9:52:44 AM Total Procedure Duration: 0 hours 3 minutes 2 seconds  Findings:                 Estimated blood loss: none. Complications:            No immediate complications. Estimated Blood Loss:     Estimated blood loss: none. Impression:               - Preparation of the colon was unsatisfactory.                           - The procedure was aborted due to poor bowel prep                            with stool present.                           - No specimens collected. Recommendation:           -  Resume previous diet.                           - Continue present medications.                           - Repeat colonoscopy after 2 day prep at the next                            available appointment for screening purposes. Lynann Bologna, MD 05/10/2018 9:59:25 AM This report has been signed electronically.

## 2018-05-10 NOTE — Telephone Encounter (Signed)
Patient is requesting that clenpiq be sent to [facility] on [address]

## 2018-05-11 ENCOUNTER — Telehealth: Payer: Self-pay

## 2018-05-11 NOTE — Telephone Encounter (Signed)
  Follow up Call-  Call back number 05/10/2018  Post procedure Call Back phone  # [phone]//[phone]  Permission to leave phone message Yes  Some recent data might be hidden     Patient questions:  Do you have a fever, pain , or abdominal swelling? No. Pain Score  0 *  Have you tolerated food without any problems? Yes.    Have you been able to return to your normal activities? Yes.    Do you have any questions about your discharge instructions: Diet   No. Medications  No. Follow up visit  No.  Do you have questions or concerns about your Care? No.  Actions: * If pain score is 4 or above: No action needed, pain <4.

## 2018-05-21 ENCOUNTER — Encounter: Payer: Medicaid Other | Admitting: Gastroenterology

## 2018-06-07 ENCOUNTER — Ambulatory Visit (AMBULATORY_SURGERY_CENTER): Payer: Self-pay | Admitting: *Deleted

## 2018-06-07 ENCOUNTER — Other Ambulatory Visit: Payer: Self-pay

## 2018-06-07 VITALS — Ht 68.0 in | Wt 255.8 lb

## 2018-06-07 DIAGNOSIS — Z800 Family history of malignant neoplasm of digestive organs: Secondary | ICD-10-CM

## 2018-06-07 MED ORDER — SUPREP BOWEL PREP KIT 17.5-3.13-1.6 GM/177ML PO SOLN: 1.0000 | Freq: Once | ORAL | 0 refills | Status: AC

## 2018-06-07 NOTE — Progress Notes (Signed)
Patient denies any allergies to egg or soy products. Patient denies complications with anesthesia/sedation.  Patient denies oxygen use at home and denies diet medications. Pamphlet given to patient on colonoscopy. 

## 2018-06-15 ENCOUNTER — Encounter: Payer: Self-pay | Admitting: Gastroenterology

## 2018-06-15 ENCOUNTER — Ambulatory Visit (AMBULATORY_SURGERY_CENTER): Payer: Medicaid Other | Admitting: Gastroenterology

## 2018-06-15 VITALS — BP 91/56 | HR 88 | Temp 98.0°F | Resp 22 | Ht 68.0 in | Wt 255.8 lb

## 2018-06-15 DIAGNOSIS — Z1211 Encounter for screening for malignant neoplasm of colon: Secondary | ICD-10-CM | POA: Diagnosis not present

## 2018-06-15 DIAGNOSIS — Z800 Family history of malignant neoplasm of digestive organs: Secondary | ICD-10-CM | POA: Diagnosis not present

## 2018-06-15 MED ORDER — SODIUM CHLORIDE 0.9 % IV SOLN: 500.0000 mL | Freq: Once | INTRAVENOUS | Status: DC

## 2018-06-15 NOTE — Progress Notes (Signed)
Pt's states no medical or surgical changes since previsit or office visit. 

## 2018-06-15 NOTE — Op Note (Signed)
[facility] Patient Name: Jesse Collins Procedure Date: 06/15/2018 1:53 PM MRN: [id number] Endoscopist: Lynann Bologna , MD Age: 52 Referring MD:  Date of Birth: 23-Apr-1966 Gender: Male Account #: [id number] Procedure:                Colonoscopy Indications:              Screening in patient at increased risk: Colorectal                            cancer in father before age 96. This is a second                            attempt at colonoscopy. First stopped due to poor                            preparation. Medicines:                Monitored Anesthesia Care Procedure:                Pre-Anesthesia Assessment:                           - Prior to the procedure, a History and Physical                            was performed, and patient medications and                            allergies were reviewed. The patient's tolerance of                            previous anesthesia was also reviewed. The risks                            and benefits of the procedure and the sedation                            options and risks were discussed with the patient.                            All questions were answered, and informed consent                            was obtained. Prior Anticoagulants: The patient has                            taken no previous anticoagulant or antiplatelet                            agents. ASA Grade Assessment: III - A patient with                            severe systemic disease. After reviewing the risks  and benefits, the patient was deemed in                            satisfactory condition to undergo the procedure.                           After obtaining informed consent, the colonoscope                            was passed under direct vision. Throughout the                            procedure, the patient's blood pressure, pulse, and                            oxygen saturations were monitored continuously. The                             Colonoscope was introduced through the anus and                            advanced to the the cecum, identified by                            appendiceal orifice and ileocecal valve. The                            colonoscopy was somewhat difficult due to poor                            endoscopic visualization and a redundant colon.                            Successful completion of the procedure was aided by                            applying abdominal pressure. The patient tolerated                            the procedure well. The quality of the bowel                            preparation was fair with retained stool specially                            in the right side of the colon. This was despite                            2-day preparation. Aggressive suctioning and                            aspiration was performed. Overall 85 to 90% of the  colonic mucosa was visualized satisfactorily. Scope In: 2:05:57 PM Scope Out: 2:27:43 PM Scope Withdrawal Time: 0 hours 10 minutes 5 seconds  Total Procedure Duration: 0 hours 21 minutes 46 seconds  Findings:                 Non-bleeding internal hemorrhoids were found during                            retroflexion. The hemorrhoids were small.                           The exam was otherwise without abnormality. Complications:            No immediate complications. Estimated Blood Loss:     Estimated blood loss: none. Impression:               -Small internal hemorrhoids.                           -Otherwise grossly normal colonoscopy. Recommendation:           - Patient has a contact number available for                            emergencies. The signs and symptoms of potential                            delayed complications were discussed with the                            patient. Return to normal activities tomorrow.                            Written discharge instructions  were provided to the                            patient.                           - Resume previous diet.                           - Continue present medications.                           - Repeat colonoscopy in 3 years for screening                            purposes with a 2-day prep. Earlier, if he has any                            new problems.                           - Return to GI clinic PRN. Lynann Bologna, MD 06/15/2018 2:34:31 PM This report has been signed electronically.

## 2018-06-15 NOTE — Patient Instructions (Signed)
Impression/Recommendations:  Hemorrhoid handout given to patient.  Resume previous diet. Continue present medications.  Repeat colonoscopy in 3 years for screening purposes with a 2-day prep.  Return to GI clinic as needed.  YOU HAD AN ENDOSCOPIC PROCEDURE TODAY AT THE [facility]:   Refer to the procedure report that was given to you for any specific questions about what was found during the examination.  If the procedure report does not answer your questions, please call your gastroenterologist to clarify.  If you requested that your care partner not be given the details of your procedure findings, then the procedure report has been included in a sealed envelope for you to review at your convenience later.  YOU SHOULD EXPECT: Some feelings of bloating in the abdomen. Passage of more gas than usual.  Walking can help get rid of the air that was put into your GI tract during the procedure and reduce the bloating. If you had a lower endoscopy (such as a colonoscopy or flexible sigmoidoscopy) you may notice spotting of blood in your stool or on the toilet paper. If you underwent a bowel prep for your procedure, you may not have a normal bowel movement for a few days.  Please Note:  You might notice some irritation and congestion in your nose or some drainage.  This is from the oxygen used during your procedure.  There is no need for concern and it should clear up in a day or so.  SYMPTOMS TO REPORT IMMEDIATELY:   Following lower endoscopy (colonoscopy or flexible sigmoidoscopy):  Excessive amounts of blood in the stool  Significant tenderness or worsening of abdominal pains  Swelling of the abdomen that is new, acute  Fever of 100F or higher For urgent or emergent issues, a gastroenterologist can be reached at any hour by calling [phone].   DIET:  We do recommend a small meal at first, but then you may proceed to your regular diet.  Drink plenty of fluids but you should  avoid alcoholic beverages for 24 hours.  ACTIVITY:  You should plan to take it easy for the rest of today and you should NOT DRIVE or use heavy machinery until tomorrow (because of the sedation medicines used during the test).    FOLLOW UP: Our staff will call the number listed on your records the next business day following your procedure to check on you and address any questions or concerns that you may have regarding the information given to you following your procedure. If we do not reach you, we will leave a message.  However, if you are feeling well and you are not experiencing any problems, there is no need to return our call.  We will assume that you have returned to your regular daily activities without incident.  If any biopsies were taken you will be contacted by phone or by letter within the next 1-3 weeks.  Please call us at [phone] if you have not heard about the biopsies in 3 weeks.    SIGNATURES/CONFIDENTIALITY: You and/or your care partner have signed paperwork which will be entered into your electronic medical record.  These signatures attest to the fact that that the information above on your After Visit Summary has been reviewed and is understood.  Full responsibility of the confidentiality of this discharge information lies with you and/or your care-partner.

## 2018-06-15 NOTE — Progress Notes (Signed)
To PACU, vss patent aw report to rn 

## 2018-06-18 ENCOUNTER — Telehealth: Payer: Self-pay

## 2018-06-18 NOTE — Telephone Encounter (Signed)
  Follow up Call-  Call back number 06/15/2018 05/10/2018  Post procedure Call Back phone  # [phone]  Permission to leave phone message Yes Yes  Some recent data might be hidden     Patient questions:  Do you have a fever, pain , or abdominal swelling? No. Pain Score  0 *  Have you tolerated food without any problems? Yes.    Have you been able to return to your normal activities? Yes.    Do you have any questions about your discharge instructions: Diet   No. Medications  No. Follow up visit  No.  Do you have questions or concerns about your Care? No.  Actions: * If pain score is 4 or above: No action needed, pain <4.

## 2018-07-12 ENCOUNTER — Other Ambulatory Visit (HOSPITAL_BASED_OUTPATIENT_CLINIC_OR_DEPARTMENT_OTHER): Payer: Self-pay

## 2018-07-12 DIAGNOSIS — G4730 Sleep apnea, unspecified: Secondary | ICD-10-CM

## 2018-07-21 ENCOUNTER — Ambulatory Visit (HOSPITAL_BASED_OUTPATIENT_CLINIC_OR_DEPARTMENT_OTHER): Payer: Medicaid Other | Attending: Specialist | Admitting: Internal Medicine

## 2018-07-21 VITALS — Ht 66.0 in | Wt 270.0 lb

## 2018-07-21 DIAGNOSIS — G4730 Sleep apnea, unspecified: Secondary | ICD-10-CM | POA: Diagnosis not present

## 2018-08-03 ENCOUNTER — Encounter (HOSPITAL_COMMUNITY): Payer: Self-pay

## 2018-08-03 ENCOUNTER — Emergency Department (HOSPITAL_COMMUNITY)
Admission: EM | Admit: 2018-08-03 | Discharge: 2018-08-03 | Disposition: A | Discharge status: Home / Self Care | Payer: Medicaid Other | Attending: Emergency Medicine | Admitting: Emergency Medicine

## 2018-08-03 ENCOUNTER — Emergency Department (HOSPITAL_COMMUNITY): Payer: Medicaid Other

## 2018-08-03 DIAGNOSIS — Y999 Unspecified external cause status: Secondary | ICD-10-CM | POA: Insufficient documentation

## 2018-08-03 DIAGNOSIS — Y939 Activity, unspecified: Secondary | ICD-10-CM | POA: Insufficient documentation

## 2018-08-03 DIAGNOSIS — F17210 Nicotine dependence, cigarettes, uncomplicated: Secondary | ICD-10-CM | POA: Insufficient documentation

## 2018-08-03 DIAGNOSIS — Z79899 Other long term (current) drug therapy: Secondary | ICD-10-CM | POA: Insufficient documentation

## 2018-08-03 DIAGNOSIS — W01198A Fall on same level from slipping, tripping and stumbling with subsequent striking against other object, initial encounter: Secondary | ICD-10-CM | POA: Diagnosis not present

## 2018-08-03 DIAGNOSIS — I10 Essential (primary) hypertension: Secondary | ICD-10-CM | POA: Diagnosis not present

## 2018-08-03 DIAGNOSIS — S20211A Contusion of right front wall of thorax, initial encounter: Secondary | ICD-10-CM

## 2018-08-03 DIAGNOSIS — R0789 Other chest pain: Secondary | ICD-10-CM | POA: Diagnosis present

## 2018-08-03 DIAGNOSIS — Y929 Unspecified place or not applicable: Secondary | ICD-10-CM | POA: Diagnosis not present

## 2018-08-03 MED ORDER — METHOCARBAMOL 1000 MG/10ML IJ SOLN
500.0000 mg | Freq: Once | INTRAMUSCULAR | Status: DC
  Filled 2018-08-03: qty 5

## 2018-08-03 NOTE — ED Triage Notes (Signed)
Pt states that he was home attempting to move refrigerator couple days ago and fell on his R side.  Pt also complains of shortness of breath upon exertion
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
## 2018-08-07 ENCOUNTER — Ambulatory Visit (INDEPENDENT_AMBULATORY_CARE_PROVIDER_SITE_OTHER): Payer: Medicaid Other | Admitting: Internal Medicine

## 2018-08-07 ENCOUNTER — Encounter: Payer: Self-pay | Admitting: Internal Medicine

## 2018-08-07 VITALS — BP 140/86 | HR 101 | Ht 70.5 in | Wt 262.0 lb

## 2018-08-07 DIAGNOSIS — J449 Chronic obstructive pulmonary disease, unspecified: Secondary | ICD-10-CM | POA: Diagnosis not present

## 2018-08-07 DIAGNOSIS — F17210 Nicotine dependence, cigarettes, uncomplicated: Secondary | ICD-10-CM | POA: Diagnosis not present

## 2018-08-07 MED ORDER — BUDESONIDE-FORMOTEROL FUMARATE 160-4.5 MCG/ACT IN AERO: 2.0000 | INHALATION_SPRAY | Freq: Two times a day (BID) | RESPIRATORY_TRACT | 11 refills | Status: AC

## 2018-08-07 MED ORDER — BUDESONIDE-FORMOTEROL FUMARATE 160-4.5 MCG/ACT IN AERO: 2.0000 | INHALATION_SPRAY | Freq: Two times a day (BID) | RESPIRATORY_TRACT | 0 refills | Status: DC

## 2018-08-07 NOTE — Patient Instructions (Addendum)
The key is to stop smoking completely before smoking completely stops you!    Work on inhaler technique:  relax and gently blow all the way out then take a nice smooth deep breath back in, triggering the inhaler at same time you start breathing in.  Hold for up to 5 seconds if you can. Blow out thru nose. Rinse and gargle with water when done      Plan A = Automatic = symbicort 160 Take 2 puffs first thing in am and then another 2 puffs about 12 hours later.      Plan B = Backup Only use your combivent inhaler as a rescue medication to be used if you can't catch your breath by resting or doing a relaxed purse lip breathing pattern.  - The less you use it, the better it will work when you need it. - Ok to use the inhaler up to 1 puffs  every 4 hours if you must but call for appointment if use goes up over your usual need - Don't leave home without it !!  (think of it like the spare tire for your car)    Please schedule a follow up office visit in 6 weeks, call sooner if needed with all medications /inhalers/ solutions in hand so we can verify exactly what you are taking. This includes all medications from all doctors and over the counters

## 2018-08-07 NOTE — Assessment & Plan Note (Signed)
4-5 min discussion re active cigarette smoking in addition to office E&M  Ask about tobacco use:   ongoing Advise quitting   I took an extended  opportunity with this patient to outline the consequences of continued cigarette use  in airway disorders based on all the data we have from the multiple national lung health studies (perfomed over decades at millions of dollars in cost)  indicating that smoking cessation, not choice of inhalers or physicians, is the most important aspect of his care.   Assess willingness:  Not committed at this point Assist in quit attempt:  Per PCP when ready Arrange follow up:   Follow up per Primary Care planned        
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
## 2018-08-07 NOTE — Assessment & Plan Note (Signed)
Spirometry 08/07/2018  FEV1 0.8 (21%)  Ratio 75% with min curvature off all rx -  08/07/2018  After extensive coaching inhaler device,  effectiveness =    75% > retry symbicort 160 2bid    DDX of  difficult airways management almost all start with A and  include Adherence, Ace Inhibitors, Acid Reflux, Active Sinus Disease, Alpha 1 Antitripsin deficiency, Anxiety masquerading as Airways dz,  ABPA,  Allergy(esp in young), Aspiration (esp in elderly), Adverse effects of meds,  Active smoking or vaping, A bunch of PE's (a small clot burden can't cause this syndrome unless there is already severe underlying pulm or vascular dz with poor reserve) plus two Bs  = Bronchiectasis and Beta blocker use..and one C= CHF   Adherence is always the initial "prime suspect" and is a multilayered concern that requires a "trust but verify" approach in every patient - starting with knowing how to use medications, especially inhalers, correctly, keeping up with refills and understanding the fundamental difference between maintenance and prns vs those medications only taken for a very short course and then stopped and not refilled.  - see hfa teaching - return with all meds in hand using a trust but verify approach to confirm accurate Medication  Reconciliation The principal here is that until we are certain that the  patients are doing what we've asked, it makes no sense to ask them to do more.    Active smoking also at top of the usual list of suspects (see separate a/p)  ? Anxiety/depression/ deconditioning > usually at the bottom of this list of usual suspects but should be  higher on this pt's based on H and P and note already on psychotropics and may interfere with adherence and also interpretation of response or lack thereof to symptom management which can be quite subjective.   ? Adverse effects of meds> none of the usual suspects listed   ? Chf/ cardiac asthma > not apparent by today's eval    >>> recheck in 6  weeks with full pfts

## 2018-08-07 NOTE — Assessment & Plan Note (Signed)
Body mass index is 37.06 kg/m.     Lab Results  Component Value Date   TSH 1.503 07/28/2009     Contributing to gerd risk/ doe/reviewed the need and the process to achieve and maintain neg calorie balance > defer f/u primary care including intermittently monitoring thyroid status.    Total time devoted to counseling  > 50 % of initial 60 min office visit:  review case with pt/ discussion of options/alternatives/ (See device teaching which extended face to face time for this visit) personally creating written customized instructions  in presence of pt  then going over those specific  Instructions directly with the pt including how to use all of the meds but in particular covering each new medication in detail and the difference between the maintenance= "automatic" meds and the prns using an action plan format for the latter (If this problem/symptom => do that organization reading Left to right).  Please see AVS from this visit for a full list of these instructions which I personally wrote for this pt and  are unique to this visit.

## 2018-08-08 DIAGNOSIS — G4730 Sleep apnea, unspecified: Secondary | ICD-10-CM | POA: Diagnosis not present

## 2018-08-08 NOTE — Procedures (Signed)
Patient Name: Jesse Collins, Mads Study Date: 07/21/2018 Gender: Male D.O.B: [birth year] Age (years): 52 Referring Provider: Cari Carawayonna Odem FNP Height (inches): 66 Interpreting Physician: Jetty Duhamellinton Jocelyn Nold MD, ABSM Weight (lbs): 270 RPSGT: [place], [place] BMI: 44 MRN: [id number] Neck Size: 17.00  CLINICAL INFORMATION Sleep Study Type: NPSG Indication for sleep study: COPD, Excessive Daytime Sleepiness, Fatigue, Hypertension, Obesity, OSA, Snoring, Witnessed Apneas  Epworth Sleepiness Score: 12  SLEEP STUDY TECHNIQUE As per the AASM Manual for the Scoring of Sleep and Associated Events v2.3 (April 2016) with a hypopnea requiring 4% desaturations.  The channels recorded and monitored were frontal, central and occipital EEG, electrooculogram (EOG), submentalis EMG (chin), nasal and oral airflow, thoracic and abdominal wall motion, anterior tibialis EMG, snore microphone, electrocardiogram, and pulse oximetry.  MEDICATIONS Medications self-administered by patient taken the night of the study : TRAZODONE  SLEEP ARCHITECTURE The study was initiated at 10:04:43 PM and ended at 4:19:57 AM.  Sleep onset time was 13.5 minutes and the sleep efficiency was 67.2%%. The total sleep time was 252 minutes.  Stage REM latency was 87.0 minutes.  The patient spent 32.9%% of the night in stage N1 sleep, 29.8%% in stage N2 sleep, 21.0%% in stage N3 and 16.3% in REM.  Alpha intrusion was absent.  Supine sleep was 35.22%.  RESPIRATORY PARAMETERS The overall apnea/hypopnea index (AHI) was 15.7 per hour. There were 14 total apneas, including 14 obstructive, 0 central and 0 mixed apneas. There were 52 hypopneas and 118 RERAs.  The AHI during Stage REM sleep was 74.6 per hour.  AHI while supine was 19.6 per hour.  The mean oxygen saturation was 92.3%. The minimum SpO2 during sleep was 81.0%.  moderate snoring was noted during this study.  CARDIAC DATA The 2 lead EKG demonstrated sinus rhythm. The mean  heart rate was 94.8 beats per minute. Other EKG findings include: Sinus Tachycardia.  LEG MOVEMENT DATA The total PLMS were 0 with a resulting PLMS index of 0.0. Associated arousal with leg movement index was 0.5 .  IMPRESSIONS - Moderate obstructive sleep apnea occurred during this study (AHI = 15.7/h).  - Difficulty initiating and maintaining sleep with frequent arousals and awakenings.  - Insufficient early sleep and events to meet protocol requirements for split CPAP titration. - No significant central sleep apnea occurred during this study (CAI = 0.0/h). - Mild oxygen desaturation was noted during this study (Min O2 = 81.0%). Mean 92.3% - The patient snored with moderate snoring volume. - EKG findings include sinus tachycardia. . - Clinically significant periodic limb movements did not occur during sleep. No significant associated arousals.  DIAGNOSIS - Obstructive Sleep Apnea (327.23 [G47.33 ICD-10])  RECOMMENDATIONS - Suggest CPAP titration sleep study or DME autopap. Other options would be based on clinical judgment. - Be careful with alcohol, sedatives and other CNS depressants that may worsen sleep apnea and disrupt normal sleep architecture. - Sleep hygiene should be reviewed to assess factors that may improve sleep quality. - Weight management and regular exercise should be initiated or continued if appropriate.  [Electronically signed] 08/08/2018 03:29 PM  Jetty Duhamellinton Nyshaun Standage MD, ABSM Diplomate, American Board of Sleep Medicine   NPI: [id number]                          Jetty Duhamellinton Quadasia Newsham Diplomate, American Board of Sleep Medicine  ELECTRONICALLY SIGNED ON:  08/08/2018, 3:23 PM [facility] PH: [phone]   FX: [phone] ACCREDITED BY THE AMERICAN  ACADEMY OF SLEEP MEDICINE

## 2018-08-09 ENCOUNTER — Encounter: Payer: Self-pay | Admitting: Internal Medicine

## 2018-08-09 DIAGNOSIS — G4733 Obstructive sleep apnea (adult) (pediatric): Secondary | ICD-10-CM

## 2018-08-09 HISTORY — DX: Obstructive sleep apnea (adult) (pediatric): G47.33

## 2018-09-18 ENCOUNTER — Ambulatory Visit: Payer: Medicaid Other | Admitting: Internal Medicine

## 2019-02-09 ENCOUNTER — Emergency Department (HOSPITAL_COMMUNITY): Payer: Medicaid Other

## 2019-02-09 ENCOUNTER — Encounter (HOSPITAL_COMMUNITY): Payer: Self-pay | Admitting: Emergency Medicine

## 2019-02-09 ENCOUNTER — Emergency Department (HOSPITAL_COMMUNITY)
Admission: EM | Admit: 2019-02-09 | Discharge: 2019-02-09 | Disposition: A | Discharge status: Home / Self Care | Payer: Medicaid Other | Attending: Emergency Medicine | Admitting: Emergency Medicine

## 2019-02-09 ENCOUNTER — Other Ambulatory Visit: Payer: Self-pay

## 2019-02-09 DIAGNOSIS — M79671 Pain in right foot: Secondary | ICD-10-CM

## 2019-02-09 DIAGNOSIS — Z79899 Other long term (current) drug therapy: Secondary | ICD-10-CM | POA: Insufficient documentation

## 2019-02-09 DIAGNOSIS — J45909 Unspecified asthma, uncomplicated: Secondary | ICD-10-CM | POA: Insufficient documentation

## 2019-02-09 DIAGNOSIS — I10 Essential (primary) hypertension: Secondary | ICD-10-CM | POA: Diagnosis not present

## 2019-02-09 DIAGNOSIS — F17210 Nicotine dependence, cigarettes, uncomplicated: Secondary | ICD-10-CM | POA: Insufficient documentation

## 2019-02-09 DIAGNOSIS — R600 Localized edema: Secondary | ICD-10-CM | POA: Diagnosis not present

## 2019-02-09 MED ORDER — ACETAMINOPHEN 500 MG PO TABS
1000.0000 mg | ORAL_TABLET | Freq: Once | ORAL | Status: AC
  Administered 2019-02-09: 1000 mg via ORAL
  Filled 2019-02-09: qty 2

## 2019-02-09 MED ORDER — CEPHALEXIN 500 MG PO CAPS: 500.0000 mg | ORAL_CAPSULE | Freq: Four times a day (QID) | ORAL | 0 refills | Status: AC

## 2019-02-09 NOTE — Discharge Instructions (Addendum)

## 2019-02-09 NOTE — ED Notes (Signed)
States that he has had pain in right foot, on and off for about a year, was supposed to go to a foot doctor but did not follow up.

## 2019-02-09 NOTE — ED Triage Notes (Signed)
Pt c/o right foot pain, started when he woke up-- there is swelling on top of foot, pt thought it might be a snake bite--no open areas noted, has slight fever.
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
## 2019-04-11 ENCOUNTER — Encounter: Payer: Self-pay | Admitting: Podiatry

## 2019-04-11 ENCOUNTER — Other Ambulatory Visit: Payer: Self-pay

## 2019-04-11 ENCOUNTER — Ambulatory Visit (INDEPENDENT_AMBULATORY_CARE_PROVIDER_SITE_OTHER): Payer: Medicaid Other

## 2019-04-11 ENCOUNTER — Ambulatory Visit: Payer: Medicaid Other | Admitting: Podiatry

## 2019-04-11 VITALS — BP 137/106 | HR 105 | Temp 97.8°F

## 2019-04-11 DIAGNOSIS — B351 Tinea unguium: Secondary | ICD-10-CM

## 2019-04-11 DIAGNOSIS — M659 Synovitis and tenosynovitis, unspecified: Secondary | ICD-10-CM

## 2019-04-11 DIAGNOSIS — M216X1 Other acquired deformities of right foot: Secondary | ICD-10-CM

## 2019-04-11 DIAGNOSIS — M79676 Pain in unspecified toe(s): Secondary | ICD-10-CM | POA: Diagnosis not present

## 2019-04-11 DIAGNOSIS — M19079 Primary osteoarthritis, unspecified ankle and foot: Secondary | ICD-10-CM
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
## 2019-07-18 ENCOUNTER — Ambulatory Visit (INDEPENDENT_AMBULATORY_CARE_PROVIDER_SITE_OTHER): Payer: Medicaid Other | Admitting: Podiatry

## 2019-07-18 DIAGNOSIS — Z5329 Procedure and treatment not carried out because of patient's decision for other reasons: Secondary | ICD-10-CM

## 2019-07-18 NOTE — Progress Notes (Signed)
No show for appt. 

## 2019-11-18 ENCOUNTER — Other Ambulatory Visit: Payer: Self-pay | Admitting: Physical Medicine and Rehabilitation

## 2019-11-18 DIAGNOSIS — M5416 Radiculopathy, lumbar region: Secondary | ICD-10-CM

## 2019-12-21 ENCOUNTER — Other Ambulatory Visit: Payer: Medicaid Other

## 2021-04-22 ENCOUNTER — Emergency Department (HOSPITAL_COMMUNITY): Payer: Medicaid Other

## 2021-04-22 ENCOUNTER — Other Ambulatory Visit: Payer: Self-pay

## 2021-04-22 ENCOUNTER — Observation Stay (HOSPITAL_COMMUNITY)
Admission: EM | Admit: 2021-04-22 | Discharge: 2021-04-23 | Disposition: A | Discharge status: Home / Self Care | Payer: Medicaid Other | Attending: Internal Medicine | Admitting: Internal Medicine

## 2021-04-22 DIAGNOSIS — J45909 Unspecified asthma, uncomplicated: Secondary | ICD-10-CM | POA: Insufficient documentation

## 2021-04-22 DIAGNOSIS — Z79899 Other long term (current) drug therapy: Secondary | ICD-10-CM | POA: Diagnosis not present

## 2021-04-22 DIAGNOSIS — Z20822 Contact with and (suspected) exposure to covid-19: Secondary | ICD-10-CM | POA: Insufficient documentation

## 2021-04-22 DIAGNOSIS — I639 Cerebral infarction, unspecified: Principal | ICD-10-CM | POA: Insufficient documentation

## 2021-04-22 DIAGNOSIS — F17210 Nicotine dependence, cigarettes, uncomplicated: Secondary | ICD-10-CM | POA: Diagnosis not present

## 2021-04-22 DIAGNOSIS — H532 Diplopia: Secondary | ICD-10-CM | POA: Diagnosis present

## 2021-04-22 DIAGNOSIS — I10 Essential (primary) hypertension: Secondary | ICD-10-CM | POA: Insufficient documentation

## 2021-04-22 DIAGNOSIS — J449 Chronic obstructive pulmonary disease, unspecified: Secondary | ICD-10-CM | POA: Insufficient documentation

## 2021-04-22 LAB — DIFFERENTIAL
Abs Immature Granulocytes: 0.01 10*3/uL (ref 0.00–0.07)
Basophils Absolute: 0 10*3/uL (ref 0.0–0.1)
Basophils Relative: 1 %
Eosinophils Absolute: 0.2 10*3/uL (ref 0.0–0.5)
Eosinophils Relative: 4 %
Immature Granulocytes: 0 %
Lymphocytes Relative: 34 %
Lymphs Abs: 1.7 10*3/uL (ref 0.7–4.0)
Monocytes Absolute: 0.7 10*3/uL (ref 0.1–1.0)
Monocytes Relative: 14 %
Neutro Abs: 2.5 10*3/uL (ref 1.7–7.7)
Neutrophils Relative %: 47 %

## 2021-04-22 LAB — CBC
HCT: 49.3 % (ref 39.0–52.0)
Hemoglobin: 15.7 g/dL (ref 13.0–17.0)
MCH: 28.2 pg (ref 26.0–34.0)
MCHC: 31.8 g/dL (ref 30.0–36.0)
MCV: 88.7 fL (ref 80.0–100.0)
Platelets: 197 10*3/uL (ref 150–400)
RBC: 5.56 MIL/uL (ref 4.22–5.81)
RDW: 15.9 % — ABNORMAL HIGH (ref 11.5–15.5)
WBC: 5.1 10*3/uL (ref 4.0–10.5)
nRBC: 0 % (ref 0.0–0.2)

## 2021-04-22 LAB — APTT: aPTT: 32 seconds (ref 24–36)

## 2021-04-22 LAB — COMPREHENSIVE METABOLIC PANEL
ALT: 17 U/L (ref 0–44)
AST: 15 U/L (ref 15–41)
Albumin: 3.8 g/dL (ref 3.5–5.0)
Alkaline Phosphatase: 55 U/L (ref 38–126)
Anion gap: 8 (ref 5–15)
BUN: 7 mg/dL (ref 6–20)
CO2: 28 mmol/L (ref 22–32)
Calcium: 9.2 mg/dL (ref 8.9–10.3)
Chloride: 103 mmol/L (ref 98–111)
Creatinine, Ser: 0.94 mg/dL (ref 0.61–1.24)
GFR, Estimated: >60 mL/min (ref >60)
Glucose, Bld: 103 mg/dL — ABNORMAL HIGH (ref 70–99)
Potassium: 3.8 mmol/L (ref 3.5–5.1)
Sodium: 139 mmol/L (ref 135–145)
Total Bilirubin: 1 mg/dL (ref 0.3–1.2)
Total Protein: 6.8 g/dL (ref 6.5–8.1)

## 2021-04-22 LAB — PROTIME-INR
INR: 1 (ref 0.8–1.2)
Prothrombin Time: 12.6 seconds (ref 11.4–15.2)

## 2021-04-22 MED ORDER — GADOBUTROL 1 MMOL/ML IV SOLN
10.0000 mL | Freq: Once | INTRAVENOUS | Status: AC | PRN
  Administered 2021-04-22: 10 mL via INTRAVENOUS

## 2021-04-22 NOTE — ED Provider Notes (Signed)
Emergency Medicine Provider Triage Evaluation Note  Jesse Collins , a 55 y.o. male  was evaluated in triage.  Pt complains of sudden onset of diplopia at 0830 this morning.  Diplopia has been constant since it began.  Patient denies any numbness, weakness, facial asymmetry, aphasia, slurred speech, neck pain, back pain, headache.  Patient was seen and his eye doctor earlier today and sent to emergency department for further work-up.  Review of Systems  Positive: Diplopia Negative: Vision loss, eye pain, eye discharge, umbness, weakness, facial asymmetry, aphasia, slurred speech, neck pain, back pain, headache.  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  No facial asymmetry, strength equal, no pronator drift.  Medical Decision Making  Medically screening exam initiated at 6:13 PM.  Appropriate orders placed.  Candice Camp was informed that the remainder of the evaluation will be completed by another provider, this initial triage assessment does not replace that evaluation, and the importance of remaining in the ED until their evaluation is complete.  Due to patient's sudden onset of diplopia this morning will obtain noncontrast head CT as well as MRI with and without contrast.   Haskel Schroeder, PA-C 04/22/21 1820    Franne Forts, DO 04/23/21 0021

## 2021-04-22 NOTE — ED Notes (Signed)
Per sort staff, unable to locate pt in waiting room. Called CT and MRI, pt not currently in either.

## 2021-04-22 NOTE — ED Triage Notes (Signed)
Pt with double vision since 0830 this morning. Sent by eye doctor to ED for eval of possible stroke. Denies headache, numbness, or weakness.

## 2021-04-22 NOTE — ED Notes (Signed)
Pt outside.  
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
## 2021-04-23 ENCOUNTER — Observation Stay (HOSPITAL_BASED_OUTPATIENT_CLINIC_OR_DEPARTMENT_OTHER): Payer: Medicaid Other

## 2021-04-23 ENCOUNTER — Other Ambulatory Visit (HOSPITAL_COMMUNITY): Payer: Self-pay

## 2021-04-23 ENCOUNTER — Encounter (HOSPITAL_COMMUNITY): Payer: Self-pay | Admitting: Internal Medicine

## 2021-04-23 ENCOUNTER — Observation Stay (HOSPITAL_COMMUNITY): Payer: Medicaid Other

## 2021-04-23 DIAGNOSIS — I6389 Other cerebral infarction: Secondary | ICD-10-CM

## 2021-04-23 DIAGNOSIS — I639 Cerebral infarction, unspecified: Principal | ICD-10-CM

## 2021-04-23 LAB — HIV ANTIBODY (ROUTINE TESTING W REFLEX): HIV Screen 4th Generation wRfx: NONREACTIVE

## 2021-04-23 LAB — CREATININE, SERUM
Creatinine, Ser: 0.91 mg/dL (ref 0.61–1.24)
GFR, Estimated: >60 mL/min (ref >60)

## 2021-04-23 LAB — LIPID PANEL
Cholesterol: 154 mg/dL (ref 0–200)
HDL: 38 mg/dL — ABNORMAL LOW (ref >40)
LDL Cholesterol: 72 mg/dL (ref 0–99)
Total CHOL/HDL Ratio: 4.1 RATIO
Triglycerides: 219 mg/dL — ABNORMAL HIGH (ref <150)
VLDL: 44 mg/dL — ABNORMAL HIGH (ref 0–40)

## 2021-04-23 LAB — CBG MONITORING, ED
Glucose-Capillary: 113 mg/dL — ABNORMAL HIGH (ref 70–99)
Glucose-Capillary: 118 mg/dL — ABNORMAL HIGH (ref 70–99)

## 2021-04-23 LAB — SARS CORONAVIRUS 2 (TAT 6-24 HRS): SARS Coronavirus 2: NEGATIVE

## 2021-04-23 LAB — ECHOCARDIOGRAM COMPLETE
Area-P 1/2: 2.82 cm2
Calc EF: 45.9 %
S' Lateral: 3.3 cm
Single Plane A2C EF: 45.8 %
Single Plane A4C EF: 45.5 %

## 2021-04-23 LAB — CBC
HCT: 45 % (ref 39.0–52.0)
Hemoglobin: 14.4 g/dL (ref 13.0–17.0)
MCH: 28.6 pg (ref 26.0–34.0)
MCHC: 32 g/dL (ref 30.0–36.0)
MCV: 89.5 fL (ref 80.0–100.0)
Platelets: 178 10*3/uL (ref 150–400)
RBC: 5.03 MIL/uL (ref 4.22–5.81)
RDW: 16 % — ABNORMAL HIGH (ref 11.5–15.5)
WBC: 4.4 10*3/uL (ref 4.0–10.5)
nRBC: 0 % (ref 0.0–0.2)

## 2021-04-23 LAB — HEMOGLOBIN A1C
Hgb A1c MFr Bld: 6.2 % — ABNORMAL HIGH (ref 4.8–5.6)
Mean Plasma Glucose: 131.24 mg/dL

## 2021-04-23 MED ORDER — ACETAMINOPHEN 650 MG RE SUPP: 650.0000 mg | RECTAL | Status: DC | PRN

## 2021-04-23 MED ORDER — ASPIRIN 300 MG RE SUPP: 300.0000 mg | Freq: Every day | RECTAL | Status: DC

## 2021-04-23 MED ORDER — ASPIRIN 325 MG PO TABS
325.0000 mg | ORAL_TABLET | Freq: Every day | ORAL | Status: DC
  Administered 2021-04-23: 325 mg via ORAL
  Filled 2021-04-23: qty 1

## 2021-04-23 MED ORDER — STROKE: EARLY STAGES OF RECOVERY BOOK: Freq: Once | Status: DC

## 2021-04-23 MED ORDER — CLOPIDOGREL BISULFATE 75 MG PO TABS: 75.0000 mg | ORAL_TABLET | Freq: Every day | ORAL | Status: DC

## 2021-04-23 MED ORDER — OXYCODONE HCL 5 MG PO TABS
15.0000 mg | ORAL_TABLET | Freq: Four times a day (QID) | ORAL | Status: DC | PRN
Start: 2021-04-23 — End: 2021-04-23
  Administered 2021-04-23: 15 mg via ORAL
  Filled 2021-04-23: qty 3

## 2021-04-23 MED ORDER — IPRATROPIUM-ALBUTEROL 0.5-2.5 (3) MG/3ML IN SOLN
3.0000 mL | RESPIRATORY_TRACT | Status: DC | PRN
Start: 2021-04-23 — End: 2021-04-23

## 2021-04-23 MED ORDER — ACETAMINOPHEN 160 MG/5ML PO SOLN: 650.0000 mg | ORAL | Status: DC | PRN

## 2021-04-23 MED ORDER — ACETAMINOPHEN 325 MG PO TABS: 650.0000 mg | ORAL_TABLET | ORAL | Status: DC | PRN

## 2021-04-23 MED ORDER — METOPROLOL SUCCINATE ER 25 MG PO TB24
25.0000 mg | ORAL_TABLET | Freq: Every day | ORAL | Status: DC
  Administered 2021-04-23: 25 mg via ORAL
  Filled 2021-04-23: qty 1

## 2021-04-23 MED ORDER — ALPRAZOLAM 0.25 MG PO TABS: 0.5000 mg | ORAL_TABLET | Freq: Two times a day (BID) | ORAL | Status: DC | PRN

## 2021-04-23 MED ORDER — ASPIRIN EC 81 MG PO TBEC: 81.0000 mg | DELAYED_RELEASE_TABLET | Freq: Every day | ORAL | Status: DC

## 2021-04-23 MED ORDER — INSULIN ASPART 100 UNIT/ML IJ SOLN: 0.0000 [IU] | Freq: Three times a day (TID) | INTRAMUSCULAR | Status: DC

## 2021-04-23 MED ORDER — ASPIRIN 81 MG PO TBEC
81.0000 mg | DELAYED_RELEASE_TABLET | Freq: Every day | ORAL | 0 refills | Status: AC
  Filled 2021-04-23: qty 30, 30d supply, fill #0

## 2021-04-23 MED ORDER — ENOXAPARIN SODIUM 40 MG/0.4ML IJ SOSY
40.0000 mg | PREFILLED_SYRINGE | INTRAMUSCULAR | Status: DC
  Administered 2021-04-23: 40 mg via SUBCUTANEOUS
  Filled 2021-04-23: qty 0.4

## 2021-04-23 MED ORDER — SENNOSIDES-DOCUSATE SODIUM 8.6-50 MG PO TABS
1.0000 | ORAL_TABLET | Freq: Every evening | ORAL | Status: DC | PRN
Start: 2021-04-23 — End: 2021-04-23

## 2021-04-23 MED ORDER — DM-GUAIFENESIN ER 30-600 MG PO TB12
1.0000 | ORAL_TABLET | Freq: Two times a day (BID) | ORAL | Status: DC | PRN
  Filled 2021-04-23: qty 1

## 2021-04-23 MED ORDER — ALBUTEROL SULFATE (2.5 MG/3ML) 0.083% IN NEBU: 2.5000 mg | INHALATION_SOLUTION | Freq: Four times a day (QID) | RESPIRATORY_TRACT | Status: DC | PRN

## 2021-04-23 MED ORDER — ROSUVASTATIN CALCIUM 5 MG PO TABS: 10.0000 mg | ORAL_TABLET | Freq: Every day | ORAL | Status: DC

## 2021-04-23 MED ORDER — HYDRALAZINE HCL 20 MG/ML IJ SOLN: 10.0000 mg | INTRAMUSCULAR | Status: DC | PRN

## 2021-04-23 MED ORDER — SODIUM CHLORIDE 0.9 % IV SOLN: INTRAVENOUS | Status: DC

## 2021-04-23 MED ORDER — CLOPIDOGREL BISULFATE 75 MG PO TABS
75.0000 mg | ORAL_TABLET | Freq: Every day | ORAL | 0 refills | Status: AC
  Filled 2021-04-23: qty 21, 21d supply, fill #0

## 2021-04-23 MED ORDER — QUETIAPINE FUMARATE 50 MG PO TABS
50.0000 mg | ORAL_TABLET | Freq: Every day | ORAL | Status: DC
  Filled 2021-04-23: qty 1

## 2021-04-23 NOTE — Discharge Summary (Signed)
Physician Discharge Summary  Jesse Collins ZOX:[id number] DOB: 06/20/1966 DOA: 04/22/2021  PCP: [facility]  Admit date: 04/22/2021 Discharge date: 04/23/2021  Admitted From: Home Disposition: Home with home health  Recommendations for Outpatient Follow-up:  Follow up with PCP in 1-2 weeks Please obtain BMP/CBC in one week your next doctors visit.  Aspirin Plavix for 3 weeks followed by aspirin monotherapy Follow-up outpatient neurology in about 3-4 weeks  Home Health: PT/OT Equipment/Devices: Discharge Condition: Stable CODE STATUS: Full Diet recommendation: 2 g salt  Brief/Interim Summary: 55 year old with history of HLD, HTN, OSA, smoker sent from PCP office for diplopia.  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  CT head negative but MRI brain showed acute infarct.  Seen by neurology team.  He was started on aspirin Plavix with routine work-up.  MRA head and neck did not show any evidence of large vessel occlusion.  Neurology team cleared patient for discharge.  Aspirin Plavix for 3 weeks followed by aspirin.  Continue statin follow-up patient in 3-4 weeks.  Patient is medically stable to be discharged.  During my evaluation his brother-in-law was also present at bedside.     Vital signs are stable     Acute CVA and right paramedian pons - CT head negative, MRI brain consistent with acute CVA - LDL 72, A1c 6.2 - MRA head and neck-negative - Seen by neurology.  Recommending aspirin and Plavix for 3 weeks followed by aspirin alone - Continue statin - PT/OT-Home health   History of COPD Tobacco use - As needed bronchodilator   Essential hypertension -Permissive hypertension, resume home blood pressure medicine over next 5 to 7 days.  There is no height or weight on file to calculate BMI.         Discharge Diagnoses:  Principal Problem:   Acute CVA (cerebrovascular accident) [facility]) Active Problems:   Cigarette smoker   Essential hypertension   COPD  GOLD 0 still smoking      Consultations: Neurology  Subjective: Feels better, no new complaints today.  He really wants to go home after he is seen by neurology team.  Discharge Exam: Vitals:   04/23/21 1051 04/23/21 1222  BP: (!) 150/104 (!) 164/110  Pulse: 85 90  Resp: 15 15  Temp:    SpO2: 100% 98%   Vitals:   04/23/21 0756 04/23/21 0951 04/23/21 1051 04/23/21 1222  BP: (!) 147/98 (!) 165/78 (!) 150/104 (!) 164/110  Pulse: 84 85 85 90  Resp: Temp: 97.7 F (36.5 C)     TempSrc: Oral     SpO2: 95% 96% 100% 98%    General: Pt is alert, awake, not in acute distress Cardiovascular: RRR, S1/S2 +, no rubs, no gallops Respiratory: CTA bilaterally, no wheezing, no rhonchi Abdominal: Soft, NT, ND, bowel sounds + Extremities: no edema, no cyanosis  Discharge Instructions   Allergies as of 04/23/2021       Reactions   Ibuprofen Anaphylaxis   Motrin/ eye and throat swelling & trouble breathing        Medication List     TAKE these medications    albuterol 108 (90 Base) MCG/ACT inhaler Commonly known as: VENTOLIN HFA Inhale 2 puffs into the lungs every 6 (six) hours as needed for wheezing or shortness of breath.   albuterol (2.5 MG/3ML) 0.083% nebulizer solution Commonly known as: PROVENTIL Take 2.5 mg by nebulization every 6 (six) hours as needed for wheezing or shortness of breath.   ALPRAZolam  0.5 MG tablet Commonly known as: XANAX Take 0.5 mg by mouth 2 (two) times daily as needed for anxiety.   aspirin 81 MG EC tablet Take 1 tablet (81 mg total) by mouth daily. Swallow whole. Start taking on: April 24, 2021   budesonide-formoterol 160-4.5 MCG/ACT inhaler Commonly known as: Symbicort Inhale 2 puffs into the lungs 2 (two) times daily.   clopidogrel 75 MG tablet Commonly known as: PLAVIX Take 1 tablet (75 mg total) by mouth daily for 21 days.   famotidine 40 MG tablet Commonly known as: PEPCID Take 40 mg by mouth every evening.    fluticasone-salmeterol 230-21 MCG/ACT inhaler Commonly known as: ADVAIR HFA Inhale 2 puffs into the lungs 2 (two) times daily.   Jardiance 10 MG Tabs tablet Generic drug: empagliflozin Take 10 mg by mouth daily.   losartan-hydrochlorothiazide 100-12.5 MG tablet Commonly known as: HYZAAR Take 1 tablet by mouth daily.   metFORMIN 500 MG tablet Commonly known as: GLUCOPHAGE Take 500 mg by mouth 2 (two) times daily.   metoprolol succinate 25 MG 24 hr tablet Commonly known as: TOPROL-XL Take 25 mg by mouth daily.   oxyCODONE 15 MG immediate release tablet Commonly known as: ROXICODONE Take 15 mg by mouth 4 (four) times daily as needed.   QUEtiapine 50 MG tablet Commonly known as: SEROQUEL Take 50 mg by mouth at bedtime.   rosuvastatin 10 MG tablet Commonly known as: CRESTOR Take 10 mg by mouth at bedtime.   testosterone cypionate 200 MG/ML injection Commonly known as: DEPOTESTOSTERONE CYPIONATE Inject 200 mg into the muscle every 14 (fourteen) days.   topiramate 50 MG tablet Commonly known as: TOPAMAX Take 50 mg by mouth daily.   varenicline 0.5 MG tablet Commonly known as: CHANTIX Take by mouth.   Vitamin D (Ergocalciferol) 1.25 MG (50000 UNIT) Caps capsule Commonly known as: DRISDOL Take 50,000 Units by mouth once a week.        Follow-up Information     [facility]).   Contact information: [address] [place] [address] [phone]         [facility]. Call in 3 week(s).   Contact information: [address]     [address] [place] [phone]               Allergies  Allergen Reactions   Ibuprofen Anaphylaxis    Motrin/ eye and throat swelling & trouble breathing    You were cared for by a hospitalist during your hospital stay. If you have any questions about your discharge medications or the care you received while you were in the  hospital after you are discharged, you can call the unit and asked to speak with the hospitalist on call if the hospitalist that took care of you is not available. Once you are discharged, your primary care physician will handle any further medical issues. Please note that no refills for any discharge medications will be authorized once you are discharged, as it is imperative that you return to your primary care physician (or establish a relationship with a primary care physician if you do not have one) for your aftercare needs so that they can reassess your need for medications and monitor your lab values.   Procedures/Studies: CT HEAD WO CONTRAST (5MM)  Result Date: 04/22/2021 CLINICAL DATA:  Diplopia EXAM: CT HEAD WITHOUT CONTRAST TECHNIQUE: Contiguous axial images were obtained from the base of the skull through the vertex without  intravenous contrast. COMPARISON:  05/06/2005 FINDINGS: Brain: There is an area of encephalomalacia in the medial aspect of the right occipital lobe consistent with prior infarct. Chronic white matter ischemic changes are noted. No acute hemorrhage, acute infarction or space-occupying mass lesion are seen. Vascular: No hyperdense vessel or unexpected calcification. Skull: Normal. Negative for fracture or focal lesion. Sinuses/Orbits: No acute finding. Other: None. IMPRESSION: Chronic ischemic changes. 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 No acute intracranial abnormality noted. Electronically Signed   By: Alcide Clever M.D.   On: 04/22/2021 20:53   MR ANGIO HEAD WO CONTRAST  Result Date: 04/23/2021 CLINICAL DATA:  Neuro deficit, acute, stroke suspected. Additional history provided: Double vision. EXAM: MRA HEAD WITHOUT CONTRAST TECHNIQUE: Angiographic images of the Circle of Willis were acquired using MRA technique without intravenous contrast. COMPARISON:  Brain MRI 04/22/2021. FINDINGS: Mild to moderately motion degraded. Anterior circulation: The  intracranial internal carotid arteries are patent. The M1 middle cerebral arteries are patent. Mild atherosclerotic irregularity of both intracranial internal carotid arteries and of the M1 right middle cerebral artery. No M2 proximal branch occlusion or high-grade proximal stenosis is identified. The anterior cerebral arteries are patent. No intracranial aneurysm is identified. Posterior circulation: The intracranial vertebral arteries are patent. The basilar artery is patent. The posterior cerebral arteries are patent. Mild stenosis within the P3 segment of the right posterior cerebral artery. Posterior communicating arteries are hypoplastic or absent bilaterally. Anatomic variants: As described. IMPRESSION: Mild to moderately motion degraded exam. No intracranial large vessel occlusion or proximal high-grade arterial stenosis identified. Mild atherosclerotic irregularity of the intracranial internal carotid arteries, M1 right middle cerebral artery and right posterior cerebral artery. Electronically Signed   By: Jackey Loge D.O.   On: 04/23/2021 09:56   MR ANGIO NECK WO CONTRAST  Result Date: 04/23/2021 CLINICAL DATA:  Neuro deficit, acute, stroke suspected. Double vision. EXAM: MRA NECK WITHOUT CONTRAST TECHNIQUE: Angiographic images of the neck were acquired using MRA technique without intravenous contrast. Carotid stenosis measurements (when applicable) are obtained utilizing NASCET criteria, using the distal internal carotid diameter as the denominator. COMPARISON:  Concurrently performed MRA of the head. FINDINGS: The proximal common carotid and proximal vertebral arteries are excluded from the field of view. The visualized common carotid and internal carotid arteries are patent within the neck. However, despite multiple scan attempts the examination is moderate to severely motion degraded. This precludes adequate evaluation for, and accurate quantification of, arterial stenoses within the common and  internal carotid arteries. The visualized vertebral arteries are patent within the neck without appreciable stenosis. IMPRESSION: The proximal common carotid and proximal vertebral arteries are excluded from the field of view. The visualized common carotid and internal carotid arteries are patent within the neck. However, despite multiple scan attempts the examination is moderate to severely motion degraded. This precludes adequate evaluation for, and accurate quantification of, arterial stenoses within the common and internal carotid arteries. The visualized vertebral arteries are patent within the neck without appreciable stenosis. Electronically Signed   By: Jackey Loge D.O.   On: 04/23/2021 10:02   MR BRAIN W WO CONTRAST  Result Date: 04/22/2021 CLINICAL DATA:  Diplopia EXAM: MRI HEAD WITHOUT AND WITH CONTRAST TECHNIQUE: Multiplanar, multiecho pulse sequences of the brain and surrounding structures were obtained without and with intravenous contrast. CONTRAST:  42mL GADAVIST GADOBUTROL 1 MMOL/ML IV SOLN COMPARISON:  None. FINDINGS: Brain: Small focus of abnormal diffusion restriction within the right paramedian pons. Chronic microhemorrhage at the right occipital lobe.  There are old right occipital left cerebellar infarcts. There is multifocal hyperintense T2-weighted signal within the white matter. Generalized volume loss without a clear lobar predilection. The midline structures are normal. There is no abnormal contrast enhancement. Vascular: Major flow voids are preserved. Skull and upper cervical spine: Normal calvarium and skull base. Visualized upper cervical spine and soft tissues are normal. Sinuses/Orbits:No paranasal sinus fluid levels or advanced mucosal thickening. No mastoid or middle ear effusion. Normal orbits. IMPRESSION: 1. Small acute infarct within the right paramedian pons. This location could affect the paramedian pontine reticular formation, which may cause diplopia. 2. Old right  occipital and left cerebellar infarcts and findings of chronic microvascular ischemia. Electronically Signed   By: Deatra Robinson M.D.   On: 04/22/2021 23:59   ECHOCARDIOGRAM COMPLETE  Result Date: 04/23/2021    ECHOCARDIOGRAM REPORT   Patient Name:   WOODFORD STREGE Date of Exam: 04/23/2021 Medical Rec #:  [id number]    Height:       71.0 in Accession #:    [id number]   Weight:       267.0 lb Date of Birth:  [birth year]    BSA:          2.384 m Patient Age:    55 years     BP:           165/78 mmHg Patient Gender: M            HR:           67 bpm. Exam Location:  Inpatient Procedure: 2D Echo, Cardiac Doppler and Color Doppler Indications:    Stroke I63.9  History:        Patient has no prior history of Echocardiogram examinations.                 Risk Factors:Current Smoker, Sleep Apnea, Dyslipidemia and                 Hypertension.  Sonographer:    Renella Cunas RDCS Referring Phys: [id number]eryle Ready [facility] IMPRESSIONS  1. Left ventricular ejection fraction, by estimation, is 50 to 55%. The left ventricle has low normal function. The left ventricle has no regional wall motion abnormalities. Left ventricular diastolic parameters are consistent with Grade II diastolic dysfunction (pseudonormalization).  2. Right ventricular systolic function is normal. The right ventricular size is normal. Tricuspid regurgitation signal is inadequate for assessing PA pressure.  3. The mitral valve is normal in structure. No evidence of mitral valve regurgitation. No evidence of mitral stenosis.  4. The aortic valve is tricuspid. Aortic valve regurgitation is trivial. No aortic stenosis is present.  5. Aortic dilatation noted. There is mild dilatation of the aortic root, measuring 42 mm.  6. The inferior vena cava is dilated in size with >50% respiratory variability, suggesting right atrial pressure of 8 mmHg. FINDINGS  Left Ventricle: Left ventricular ejection fraction, by estimation, is 50 to 55%. The left ventricle has low normal  function. The left ventricle has no regional wall motion abnormalities. The left ventricular internal cavity size was normal in size. There is no left ventricular hypertrophy. Left ventricular diastolic parameters are consistent with Grade II diastolic dysfunction (pseudonormalization). Right Ventricle: The right ventricular size is normal. No increase in right ventricular wall thickness. Right ventricular systolic function is normal. Tricuspid regurgitation signal is inadequate for assessing PA pressure. Left Atrium: Left atrial size was normal in size. Right Atrium: Right atrial size was normal in size. Pericardium: There is no  evidence of pericardial effusion. Mitral Valve: The mitral valve is normal in structure. No evidence of mitral valve regurgitation. No evidence of mitral valve stenosis. Tricuspid Valve: The tricuspid valve is normal in structure. Tricuspid valve regurgitation is not demonstrated. Aortic Valve: The aortic valve is tricuspid. Aortic valve regurgitation is trivial. No aortic stenosis is present. Pulmonic Valve: The pulmonic valve was normal in structure. Pulmonic valve regurgitation is not visualized. Aorta: Aortic dilatation noted. There is mild dilatation of the aortic root, measuring 42 mm. Venous: The inferior vena cava is dilated in size with greater than 50% respiratory variability, suggesting right atrial pressure of 8 mmHg. IAS/Shunts: No atrial level shunt detected by color flow Doppler.  LEFT VENTRICLE PLAX 2D LVIDd:         4.40 cm      Diastology LVIDs:         3.30 cm      LV e' medial:    6.00 cm/s LV PW:         1.00 cm      LV E/e' medial:  12.6 LV IVS:        1.00 cm      LV e' lateral:   7.30 cm/s LVOT diam:     2.10 cm      LV E/e' lateral: 10.3 LV SV:         69 LV SV Index:   29 LVOT Area:     3.46 cm  LV Volumes (MOD) LV vol d, MOD A2C: 139.0 ml LV vol d, MOD A4C: 145.0 ml LV vol s, MOD A2C: 75.4 ml LV vol s, MOD A4C: 79.0 ml LV SV MOD A2C:     63.6 ml LV SV MOD A4C:      145.0 ml LV SV MOD BP:      65.1 ml RIGHT VENTRICLE RV S prime:     12.00 cm/s TAPSE (M-mode): 1.9 cm LEFT ATRIUM             Index       RIGHT ATRIUM           Index LA diam:        3.80 cm 1.59 cm/m  RA Area:     17.15 cm LA Vol (A2C):   47.3 ml 19.84 ml/m RA Volume:   48.85 ml  20.49 ml/m LA Vol (A4C):   19.6 ml 8.22 ml/m LA Biplane Vol: 33.8 ml 14.18 ml/m  AORTIC VALVE LVOT Vmax:   116.00 cm/s LVOT Vmean:  74.300 cm/s LVOT VTI:    0.198 m  AORTA Ao Root diam: 4.20 cm Ao Desc diam: 3.00 cm MITRAL VALVE MV Area (PHT): 2.82 cm    SHUNTS MV Decel Time: 269 msec    Systemic VTI:  0.20 m MV E velocity: 75.50 cm/s  Systemic Diam: 2.10 cm MV A velocity: 62.40 cm/s MV E/A ratio:  1.21 Dalton McleanMD Electronically signed by Wilfred Lacy Signature Date/Time: 04/23/2021/11:36:12 AM    Final      The results of significant diagnostics from this hospitalization (including imaging, microbiology, ancillary and laboratory) are listed below for reference.     Microbiology: Recent Results (from the past 240 hour(s))  SARS CORONAVIRUS 2 (TAT 6-24 HRS) Nasopharyngeal Nasopharyngeal Swab     Status: None   Collection Time: 04/23/21  3:10 AM   Specimen: Nasopharyngeal Swab  Result Value Ref Range Status   SARS Coronavirus 2 NEGATIVE NEGATIVE Final    Comment: (NOTE) SARS-CoV-2 target nucleic  acids are NOT DETECTED.  The SARS-CoV-2 RNA is generally detectable in upper and lower respiratory specimens during the acute phase of infection. Negative results do not preclude SARS-CoV-2 infection, do not rule out co-infections with other pathogens, and should not be used as the sole basis for treatment or other patient management decisions. Negative results must be combined with clinical observations, patient history, and epidemiological information. The expected result is Negative.  Fact Sheet for Patients: HairSlick.no  Fact Sheet for Healthcare  Providers: quierodirigir.com  This test is not yet approved or cleared by the [place] FDA and  has been authorized for detection and/or diagnosis of SARS-CoV-2 by FDA under an Emergency Use Authorization (EUA). This EUA will remain  in effect (meaning this test can be used) for the duration of the COVID-19 declaration under Se ction 564(b)(1) of the Act, 21 U.S.C. section 360bbb-3(b)(1), unless the authorization is terminated or revoked sooner.  Performed at [facility], [address] [place], [place] [address]      Labs: BNP (last 3 results) No results for input(s): BNP in the last 8760 hours. Basic Metabolic Panel: Recent Labs  Lab 04/22/21 1817 04/23/21 0309  NA 139  --   K 3.8  --   CL 103  --   CO2 28  --   GLUCOSE 103*  --   BUN 7  --   CREATININE 0.94 0.91  CALCIUM 9.2  --    Liver Function Tests: Recent Labs  Lab 04/22/21 1817  AST 15  ALT 17  ALKPHOS 55  BILITOT 1.0  PROT 6.8  ALBUMIN 3.8   No results for input(s): LIPASE, AMYLASE in the last 168 hours. No results for input(s): AMMONIA in the last 168 hours. CBC: Recent Labs  Lab 04/22/21 1817 04/23/21 0309  WBC 5.1 4.4  NEUTROABS 2.5  --   HGB 15.7 14.4  HCT 49.3 45.0  MCV 88.7 89.5  PLT 197 178   Cardiac Enzymes: No results for input(s): CKTOTAL, CKMB, CKMBINDEX, TROPONINI in the last 168 hours. BNP: Invalid input(s): POCBNP CBG: Recent Labs  Lab 04/23/21 0801 04/23/21 1156  GLUCAP 113* 118*   D-Dimer No results for input(s): DDIMER in the last 72 hours. Hgb A1c Recent Labs    04/23/21 0309  HGBA1C 6.2*   Lipid Profile Recent Labs    04/23/21 0309  CHOL 154  HDL 38*  LDLCALC 72  TRIG 621*  CHOLHDL 4.1   Thyroid function studies No results for input(s): TSH, T4TOTAL, T3FREE, THYROIDAB in the last 72 hours.  Invalid input(s): FREET3 Anemia work up No results for input(s): VITAMINB12, FOLATE, FERRITIN, TIBC, IRON, RETICCTPCT in  the last 72 hours. Urinalysis No results found for: COLORURINE, APPEARANCEUR, LABSPEC, PHURINE, GLUCOSEU, HGBUR, BILIRUBINUR, KETONESUR, PROTEINUR, UROBILINOGEN, NITRITE, LEUKOCYTESUR Sepsis Labs Invalid input(s): PROCALCITONIN,  WBC,  LACTICIDVEN Microbiology Recent Results (from the past 240 hour(s))  SARS CORONAVIRUS 2 (TAT 6-24 HRS) Nasopharyngeal Nasopharyngeal Swab     Status: None   Collection Time: 04/23/21  3:10 AM   Specimen: Nasopharyngeal Swab  Result Value Ref Range Status   SARS Coronavirus 2 NEGATIVE NEGATIVE Final    Comment: (NOTE) SARS-CoV-2 target nucleic acids are NOT DETECTED.  The SARS-CoV-2 RNA is generally detectable in upper and lower respiratory specimens during the acute phase of infection. Negative results do not preclude SARS-CoV-2 infection, do not rule out co-infections with other pathogens, and should not be used as the sole basis for treatment or other patient management  decisions. Negative results must be combined with clinical observations, patient history, and epidemiological information. The expected result is Negative.  Fact Sheet for Patients: HairSlick.no  Fact Sheet for Healthcare Providers: quierodirigir.com  This test is not yet approved or cleared by the [place] FDA and  has been authorized for detection and/or diagnosis of SARS-CoV-2 by FDA under an Emergency Use Authorization (EUA). This EUA will remain  in effect (meaning this test can be used) for the duration of the COVID-19 declaration under Se ction 564(b)(1) of the Act, 21 U.S.C. section 360bbb-3(b)(1), unless the authorization is terminated or revoked sooner.  Performed at [facility], [address] [place], [place] [address]      Time coordinating discharge:  I have spent 35 minutes face to face with the patient and on the ward discussing the patients care, assessment, plan and disposition with other  care givers. >50% of the time was devoted counseling the patient about the risks and benefits of treatment/Discharge disposition and coordinating care.   SIGNED:   Dimple Nanas, MD  [facility] 04/23/2021, 12:35 PM   If 7PM-7AM, please contact night-coverage

## 2021-04-23 NOTE — Progress Notes (Signed)
  Echocardiogram 2D Echocardiogram has been performed.  Stark Bray Swaim 04/23/2021, 10:59 AM

## 2021-04-23 NOTE — Progress Notes (Signed)
Physical Therapy Evaluation Patient Details Name: Jesse Collins MRN: [id number] DOB: 11/18/1965 Today's Date: 04/23/2021   History of Present Illness  Pt is a 55yo male presenting to [facility] ED on 8/18 from his opthamalogist with complaints of diplopia. 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 PMH: COPD, HTN, MRSA, history of smoking.   Clinical Impression  Pt presents with impairments above and problems below. Pt required min guard for transfers and ambulation in the hallway. Pt continues to report trouble with vision especially when he is "outside in bright light." During ambulation, pt reported that objects on his left side appeared to be closer than they were; reports that he felt he might run into them. Pt has intermittent help at home from family and friends. Expect pt will progress well. Recommending HHPT upon discharge to address current impairments. We will continue to follow him acutely to promote independence with functional mobility.    Follow Up Recommendations Home health PT;Supervision for mobility/OOB    Equipment Recommendations  Rolling walker with 5" wheels    Recommendations for Other Services       Precautions / Restrictions Precautions Precautions: Fall Restrictions Weight Bearing Restrictions: No      Mobility  Bed Mobility               General bed mobility comments: Pt seated EOB on arrival and at exit wanted to sit up for breakfast    Transfers Overall transfer level: Needs assistance Equipment used: Straight cane Transfers: Sit to/from Stand Sit to Stand: Min guard         General transfer comment: Pt min guard for safety only  Ambulation/Gait Ambulation/Gait assistance: Min guard Gait Distance (Feet): 200 Feet Assistive device: Straight cane;IV Pole (Pt started ambulation with straight cane but with increased distance demonstrated fatigue and used IV for last ~50 feet) Gait Pattern/deviations: Step-through pattern;Decreased  dorsiflexion - left;Decreased dorsiflexion - right;Shuffle Gait velocity: decreased   General Gait Details: Pt demonstrated ER of both feet with decreased DF as well. Noted shuffling of bilateral feet.  During ambulation pt reported that objects on the left appeared to be closer to him than they were and he thought he might run into them. Pt completed dynamic gait tasks without LOB although did report visual symptoms. Pt also reporting lines on the floor were "coming together" as he ambulated down the hall.  Further ambulation and dyanmic gait tasks deferred secondary to fatigue. Educated about pursed lip breathing to help with SOB.  Stairs            Wheelchair Mobility    Modified Rankin (Stroke Patients Only) Modified Rankin (Stroke Patients Only) Pre-Morbid Rankin Score: No significant disability Modified Rankin: Moderately severe disability     Balance Overall balance assessment: Needs assistance Sitting-balance support: Feet supported Sitting balance-Leahy Scale: Poor     Standing balance support: Single extremity supported;Bilateral upper extremity supported;During functional activity Standing balance-Leahy Scale: Poor Standing balance comment: Pt reliant on single UE support during static stance (cane); reliant on cane and IV pole during ambulation                 Standardized Balance Assessment Standardized Balance Assessment : Dynamic Gait Index   Dynamic Gait Index Level Surface: Mild Impairment Gait with Horizontal Head Turns: Mild Impairment Gait with Vertical Head Turns: Mild Impairment Gait and Pivot Turn: Moderate Impairment Step Over Obstacle: Mild Impairment       Pertinent Vitals/Pain Pain Assessment: 0-10 Pain Score: 9  Pain Location: Back Pain Descriptors / Indicators: Constant;Aching Pain Intervention(s): Monitored during session;Repositioned    Home Living Family/patient expects to be discharged to:: Private residence Living  Arrangements: Alone (girlfriend moving in soon, living alone currently) Available Help at Discharge: Available PRN/intermittently;Family;Friend(s) Type of Home: House Home Access: Level entry     Home Layout: One level Home Equipment: Toilet riser;Cane - single point      Prior Function Level of Independence: Independent with assistive device(s)         Comments: uses cane for gait, IND with ADLs, takes sleeping medication and analgesics for back pain but cant remember the names.     Hand Dominance   Dominant Hand: Right    Extremity/Trunk Assessment   Upper Extremity Assessment Upper Extremity Assessment: Defer to OT evaluation    Lower Extremity Assessment Lower Extremity Assessment: Generalized weakness;LLE deficits/detail;RLE deficits/detail RLE Deficits / Details: Strength grossly 4/5 RLE Sensation: WNL LLE Deficits / Details: Strength grossly 4/5, though hip flexion was 3+/5. LLE Sensation: WNL    Cervical / Trunk Assessment Cervical / Trunk Assessment: Kyphotic  Communication   Communication: No difficulties  Cognition Arousal/Alertness: Awake/alert Behavior During Therapy: WFL for tasks assessed/performed Overall Cognitive Status: No family/caregiver present to determine baseline cognitive functioning                                 General Comments: Decreased safety awareness. Reports he drove to hospital even with symptoms and wanted to drive home.      General Comments General comments (skin integrity, edema, etc.): Pt demonstrated decreased peripheral vision of the left eye compared to the right; during horizontal visual tracking some lateral jumping abnormalities were noted of the left eye. Would benefit from further assessment.    Exercises     Assessment/Plan    PT Assessment Patient needs continued PT services  PT Problem List Decreased range of motion;Decreased strength;Decreased activity tolerance;Decreased balance;Decreased  mobility;Decreased coordination;Pain;Decreased safety awareness       PT Treatment Interventions DME instruction;Gait training;Functional mobility training;Therapeutic activities;Therapeutic exercise;Balance training;Patient/family education    PT Goals (Current goals can be found in the Care Plan section)  Acute Rehab PT Goals Patient Stated Goal: to reduce diplopia PT Goal Formulation: With patient Time For Goal Achievement: 05/07/21 Potential to Achieve Goals: Good    Frequency Min 4X/week   Barriers to discharge Decreased caregiver support      Co-evaluation               AM-PAC PT "6 Clicks" Mobility  Outcome Measure Help needed turning from your back to your side while in a flat bed without using bedrails?: None Help needed moving from lying on your back to sitting on the side of a flat bed without using bedrails?: None Help needed moving to and from a bed to a chair (including a wheelchair)?: A Little Help needed standing up from a chair using your arms (e.g., wheelchair or bedside chair)?: A Little Help needed to walk in hospital room?: A Little Help needed climbing 3-5 steps with a railing? : A Lot 6 Click Score: 19    End of Session Equipment Utilized During Treatment: Gait belt Activity Tolerance: Patient limited by fatigue Patient left: with call bell/phone within reach;with nursing/sitter in room;in bed (Sitting EOB on stretcher in ED with nursing present) Nurse Communication: Mobility status;Patient requests pain meds PT Visit Diagnosis: Other symptoms and signs involving the nervous system (  R29.898);Pain;Muscle weakness (generalized) (M62.81);Other abnormalities of gait and mobility (R26.89) Pain - part of body:  (back)    Time: 0981-1914 PT Time Calculation (min) (ACUTE ONLY): 30 min   Charges:   PT Evaluation $PT Eval Moderate Complexity: 1 Mod PT Treatments $Gait Training: 8-22 mins        Johnn Hai, SPT Johnn Hai 04/23/2021,  11:36 AM

## 2021-04-23 NOTE — ED Notes (Signed)
Pt is requesting his oxycontin 15mg  q4prn for back pain. MD notified. Waiting for orders.
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
## 2021-04-23 NOTE — ED Notes (Signed)
PT walking pt in hallway.

## 2021-04-23 NOTE — Progress Notes (Addendum)
STROKE TEAM PROGRESS NOTE    Interval History   No acute events overnight, patient is resting in bed with no family at the bedside.   His stroke work up was explained to him. Was informed that he will work with PT/OT today and that an eye patch should help his double vision.   Neurological examination pertinent for vertical skew deviation in the left eye.  Pertinent Lab Work and Imaging    04/22/21 CT Head WO IV Contrast Chronic ischemic changes. 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 No acute intracranial abnormality noted.  MRA Head WO Contrast Mild to moderately motion degraded exam.   No intracranial large vessel occlusion or proximal high-grade arterial stenosis identified.   Mild atherosclerotic irregularity of the intracranial internal carotid arteries, M1 right middle cerebral artery and right posterior cerebral artery.  MRA Neck WO Contrast  The proximal common carotid and proximal vertebral arteries are excluded from the field of view.   The visualized common carotid and internal carotid arteries are patent within the neck. However, despite multiple scan attempts the examination is moderate to severely motion degraded. This precludes adequate evaluation for, and accurate quantification of, arterial stenoses within the common and internal carotid arteries.   The visualized vertebral arteries are patent within the neck without appreciable stenosis   04/22/21 MRI Brain WO IV Contrast 1. Small acute infarct within the right paramedian pons. This location could affect the paramedian pontine reticular formation, which may cause diplopia. 2. 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   Echocardiogram Complete   1. Left ventricular ejection fraction, by estimation, is 50 to 55%. The  left ventricle has low normal function. The left ventricle has no regional  wall motion abnormalities. Left ventricular diastolic  parameters are  consistent with Grade II diastolic  dysfunction (pseudonormalization).   2. Right ventricular systolic function is normal. The right ventricular  size is normal. Tricuspid regurgitation signal is inadequate for assessing  PA pressure.   3. The mitral valve is normal in structure. No evidence of mitral valve  regurgitation. No evidence of mitral stenosis.   4. The aortic valve is tricuspid. Aortic valve regurgitation is trivial.  No aortic stenosis is present.   5. Aortic dilatation noted. There is mild dilatation of the aortic root,  measuring 42 mm.   6. The inferior vena cava is dilated in size with >50% respiratory  variability, suggesting right atrial pressure of 8 mmHg.   Physical Examination   Constitutional: Calm, appropriate for condition  Cardiovascular: Normal RR Respiratory: No increased WOB   Mental status: AAOx4, following commands  Speech: Fluent, repetition and naming intact  Cranial nerves: EOMI with vertical skew noted to the left eye, with hypotropia of the left eye.  Vertical d diplopia mostly on downgaze VFF, Face symmetric, Tongue midline  Motor: Normal bulk and tone. No drift. Strength 5/5 throughout  Sensory: Sensation intact to light touch  Coordination: FNF + HTS intact Gait: Deferred  Assessment and Plan   [name] is a 55 y.o. 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   #Right Pontine Stroke  Patient presented with the symptoms described above. At this time, stroke work up is complete. His MRI Brain revealed a right paramedian pontine stroke. MRA Head and Neck showed no significant stenosis. Echo w/EF 50 to 55 %, LA normal size. Stroke  labs were completed including Lipid panel w/LDL 72 and Hemoglobin A1C 6.2. Stroke etiology is small vessel disease in the setting of hypertension, prediabetes, hyperlipidemia.  - DAPT for 21 days followed  by Aspirin monotherapy  - Continue home Crestor 10 mg for stroke prevention  - At discharge please place ambulatory referral to neurology for stroke follow up   #Hypertension He has a history of HTN and takes Losartan-HCTZ and Metoprolol at home. Currently blood pressure is trending in the 120-140 range. Recommend permissive hypertension 48 hours post stroke and from there, gradually reduce the blood pressure, avoiding any acute drops. Long term blood pressure goal is < 140/90.  #Hyperlipidemia From a stroke prevention stand point, the LDL goal is < 70. His LDL is close to goal at 72, can continue home Crestor 10 mg.   #Stroke Dysphagia Screening Passed bedside swallow evaluation for diet   #DMII  Hemoglobin a1c this admission 6.2, at goal from a stroke standpoint < 7. Was taking Jardiance and Metformin at  home. Recommend management of diabetes with SSI while inpatient and to resume home regimen at discharge given it was effective for managing his diabetes.   Given stroke work up is complete, neurology will sign off. From a neurology perspective he is ok to discharge after being seen by PT/OT and primary team following PT/OT recommendations. Please reach out via Amion or chat with any questions.   Hospital day # 0  Stark Jock, NP  [facility] Nurse Practitioner Patient seen and discussed with attending physician Dr. Pearlean Brownie   I have personally obtained history,examined this patient, reviewed notes, independently viewed imaging studies, participated in medical decision making and plan of care.ROS completed by me personally and pertinent positives fully documented  I have made any additions or clarifications directly to the above note. Agree with note above.  Patient presented with diplopia secondary to pontine infarct likely from small vessel disease.  Echocardiogram is pending MR angiogram shows only mild changes of small vessel disease and no large vessel occlusion.  LDL  cholesterol 72 mg percent hemoglobin A1c 6.2.  Recommend dual antiplatelet therapy of aspirin and Plavix for 3 weeks followed by aspirin alone.  Aggressive risk factor modification.  Physical Occupational Therapy.  Discussed with Dr. Nelson Chimes.  Greater than 50% time during this 35-minute visit was spent on counseling and coordination of care with diplopia and lacunar stroke and answering questions about stroke prevention.  Delia Heady, MD Medical Director [facility] Pager: [phone] 04/23/2021 2:39 PM   To contact Stroke Continuity provider, please refer to [email or web address]. After hours, contact General Neurology

## 2021-04-23 NOTE — Evaluation (Signed)
Occupational Therapy Evaluation Patient Details Name: Jesse Collins MRN: [id number] DOB: March 05, 1966 Today's Date: 04/23/2021    History of Present Illness Pt is a 55yo male presenting to [facility] ED on 8/18 from his opthamalogist with complaints of diplopia. 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 PMH: COPD, HTN, MRSA, history of smoking.   Clinical Impression   Pt admitted with the above listed diagnosis.  He presents to OT with diplopia, impaired occulomotor skills, and impaired balance.  He currently requires min guard assist for ADLs and functional mobility.  He was instructed in an HEP for occulomotor ROM and convergence, and was provided with clear lenses with nasal occlusion to reduce diplopia while allowing eyes to work binocularly, and allowing for peripheral vision input.  Pt was instructed not to drive until cleared by MD>  He reports he lives with his girlfriend and was mod I with ADLs PTA.  Recommend HHOT as he does not have transportation to OP.  Will defer further OT services to [facility] and acute OT will sign off at this time.     Follow Up Recommendations  Home health OT;Supervision - Intermittent    Equipment Recommendations  None recommended by OT    Recommendations for Other Services       Precautions / Restrictions Precautions Precautions: Fall Restrictions Weight Bearing Restrictions: No      Mobility Bed Mobility               General bed mobility comments: Pt seated EOB on arrival and at exit wanted to sit up for breakfast    Transfers Overall transfer level: Needs assistance Equipment used: Straight cane Transfers: Sit to/from Stand;Stand Pivot Transfers Sit to Stand: Supervision Stand pivot transfers: Supervision       General transfer comment: Pt min guard for safety only    Balance Overall balance assessment: Needs assistance Sitting-balance support: Feet supported Sitting balance-Leahy Scale: Good (able to don/doff socks Edge of  stretcher)     Standing balance support: No upper extremity supported Standing balance-Leahy Scale: Fair Standing balance comment: able to maintain static standing without LOB                 Standardized Balance Assessment Standardized Balance Assessment : Dynamic Gait Index   Dynamic Gait Index Level Surface: Mild Impairment Gait with Horizontal Head Turns: Mild Impairment Gait with Vertical Head Turns: Mild Impairment Gait and Pivot Turn: Moderate Impairment Step Over Obstacle: Mild Impairment     ADL either performed or assessed with clinical judgement   ADL Overall ADL's : Needs assistance/impaired Eating/Feeding: Independent   Grooming: Wash/dry face;Wash/dry hands;Oral care;Min guard;Standing   Upper Body Bathing: Set up;Sitting   Lower Body Bathing: Min guard;Sit to/from stand   Upper Body Dressing : Set up;Sitting   Lower Body Dressing: Min guard;Sit to/from stand   Toilet Transfer: Min guard;Ambulation;Comfort height toilet;Grab bars   Toileting- Clothing Manipulation and Hygiene: Min guard;Sit to/from stand       Functional mobility during ADLs: Min guard;Cane General ADL Comments: min guard for safety     Vision Baseline Vision/History: Wears glasses Wears Glasses: Reading only (pt is unclear about how often he wears glasses.  States he wears them for reading and when he needs them.  He has bifocals) Patient Visual Report: Diplopia Vision Assessment?: Yes Eye Alignment: Impaired (comment) Ocular Range of Motion: Restricted on the left Alignment/Gaze Preference: Within Defined Limits Tracking/Visual Pursuits: Impaired - to be further tested in functional context Convergence:  Impaired (comment) Depth Perception: Undershoots Additional Comments: Pt with intermittently dysconjugate gaze.  He is a poor historian when providing details re: when and what situations he has diplopia.  He denies diplopia during testing except with occasional diplopia  with far Lt and far Rt gaze.  He also had difficulty following commands for visual testing.   Nystagumus noted Rt eye.  Lt eye does not fully abduct.     Perception Perception Perception Tested?: Yes   Praxis Praxis Praxis tested?: Within functional limits    Pertinent Vitals/Pain Pain Assessment: Faces Pain Score: 9  Faces Pain Scale: Hurts little more Pain Location: Back Pain Descriptors / Indicators: Constant;Aching Pain Intervention(s): Monitored during session     Hand Dominance Right   Extremity/Trunk Assessment Upper Extremity Assessment Upper Extremity Assessment: Overall WFL for tasks assessed      Cervical / Trunk Assessment Cervical / Trunk Assessment: Normal   Communication Communication Communication: No difficulties   Cognition Arousal/Alertness: Awake/alert Behavior During Therapy: WFL for tasks assessed/performed Overall Cognitive Status: Within Functional Limits for tasks assessed                                 General Comments: Pt with decreased safety awareness, and is impulsive, but anticipate he is at baseline   General Comments  Pt demonstrated decreased peripheral vision of the left eye compared to the right; during horizontal visual tracking some lateral jumping abnormalities were noted of the left eye. Would benefit from further assessment.    Exercises Exercises: Other exercises Other Exercises Other Exercises: Pt was provided with clear lenses with Lt nasal portion occluded to reduce diplopia while allowing for peripheral vision input and allowing the eyes to work binocularly.  He reports vision is better when he was ambulating and when looking around his room.  He was instructed to wear glasses during waking hours Other Exercises: Pt instructed to avoid driving until cleared by MD. Other Exercises: Pt was instructed in occulomotor and convergence exercises and was able to return demonstration   Shoulder Instructions       Home Living Family/patient expects to be discharged to:: Private residence Living Arrangements: Alone Available Help at Discharge: Available PRN/intermittently;Family;Friend(s) Type of Home: House Home Access: Level entry     Home Layout: One level     Bathroom Shower/Tub: Chief Strategy Officer: Standard Bathroom Accessibility: No   Home Equipment: Toilet riser;Cane - single point          Prior Functioning/Environment Level of Independence: Independent with assistive device(s)        Comments: uses cane for gait, IND with ADLs, takes sleeping medication and analgesics for back pain but cant remember the names.        OT Problem List: Impaired vision/perception;Decreased safety awareness      OT Treatment/Interventions:      OT Goals(Current goals can be found in the care plan section) Acute Rehab OT Goals Patient Stated Goal: for vision to get back to normal  OT Frequency:     Barriers to D/C:            Co-evaluation              AM-PAC OT "6 Clicks" Daily Activity     Outcome Measure Help from another person eating meals?: None Help from another person taking care of personal grooming?: A Little Help from another person toileting, which includes using toliet,  bedpan, or urinal?: A Little Help from another person bathing (including washing, rinsing, drying)?: A Little Help from another person to put on and taking off regular upper body clothing?: A Little Help from another person to put on and taking off regular lower body clothing?: A Little 6 Click Score: 19   End of Session Equipment Utilized During Treatment: Other (comment) [facility]) Nurse Communication: Mobility status  Activity Tolerance: Patient tolerated treatment well Patient left: in bed;with call bell/phone within reach  OT Visit Diagnosis: Low vision, both eyes (H54.2)                Time: 5277-8242 OT Time Calculation (min): 45 min Charges:  OT General Charges $OT  Visit: 1 Visit OT Evaluation $OT Eval Low Complexity: 1 Low OT Treatments $Self Care/Home Management : 8-22 mins $Therapeutic Activity: 8-22 mins  Eber Jones., OTR/L Acute Rehabilitation Services Pager [phone] Office [phone]   Jeani Hawking M 04/23/2021, 1:17 PM

## 2021-04-23 NOTE — ED Notes (Signed)
Hospitalist at bedside 

## 2021-04-23 NOTE — ED Notes (Signed)
Per case management, pt is cleared for discharge and will be called regarding home health services.

## 2021-04-23 NOTE — TOC CAGE-AID Note (Signed)
Transition of Care [facility]) - CAGE-AID Screening   Patient Details  Name: Jesse Collins MRN: [id number] Date of Birth: 13-Jan-1966  Transition of Care [facility]) CM/SW Contact:    Flo Berroa C Tarpley-Carter, LCSWA Phone Number: 04/23/2021, 9:29 AM   Clinical Narrative: Pt participated in Cage-Aid.  Pt stated he does not use substance or ETOH currently.  Pt does has a previous history of substance and ETOH use.  Pt was not offered resources, due to no current usage of substance or ETOH.     Guss Farruggia Tarpley-Carter, MSW, LCSW-A Pronouns:  She/Her/Hers [facility] HealthTransitions of Care Clinical Social Worker Direct Number:  [phone] [email or web address]   CAGE-AID Screening:    Have You Ever Felt You Ought to Cut Down on Your Drinking or Drug Use?: No Have People Annoyed You By [employer] Your Drinking Or Drug Use?: No Have You Felt Bad Or Guilty About Your Drinking Or Drug Use?: No Have You Ever Had a Drink or Used Drugs First Thing In The Morning to Steady Your Nerves or to Get Rid of a Hangover?: No CAGE-AID Score: 0  Substance Abuse Education Offered: No
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
## 2021-04-30 ENCOUNTER — Other Ambulatory Visit (HOSPITAL_COMMUNITY): Payer: Self-pay

## 2021-05-05 ENCOUNTER — Other Ambulatory Visit (HOSPITAL_COMMUNITY): Payer: Self-pay

## 2021-09-13 ENCOUNTER — Encounter: Payer: Self-pay | Admitting: Gastroenterology

## 2022-06-28 ENCOUNTER — Ambulatory Visit (INDEPENDENT_AMBULATORY_CARE_PROVIDER_SITE_OTHER): Payer: Medicaid Other

## 2022-06-28 ENCOUNTER — Ambulatory Visit (INDEPENDENT_AMBULATORY_CARE_PROVIDER_SITE_OTHER): Payer: Medicaid Other | Admitting: Podiatry

## 2022-06-28 DIAGNOSIS — M79675 Pain in left toe(s): Secondary | ICD-10-CM | POA: Diagnosis not present

## 2022-06-28 DIAGNOSIS — M79674 Pain in right toe(s): Secondary | ICD-10-CM | POA: Diagnosis not present

## 2022-06-28 DIAGNOSIS — M722 Plantar fascial fibromatosis: Secondary | ICD-10-CM

## 2022-06-28 DIAGNOSIS — B351 Tinea unguium: Secondary | ICD-10-CM

## 2022-06-28 MED ORDER — BETAMETHASONE SOD PHOS & ACET 6 (3-3) MG/ML IJ SUSP
3.0000 mg | Freq: Once | INTRAMUSCULAR | Status: AC
  Administered 2022-06-28: 3 mg via INTRA_ARTICULAR
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
## 2022-10-17 DIAGNOSIS — J449 Chronic obstructive pulmonary disease, unspecified: Secondary | ICD-10-CM | POA: Diagnosis not present

## 2022-10-18 ENCOUNTER — Ambulatory Visit (INDEPENDENT_AMBULATORY_CARE_PROVIDER_SITE_OTHER): Payer: Medicaid Other | Admitting: Podiatry

## 2022-10-18 ENCOUNTER — Encounter: Payer: Self-pay | Admitting: Podiatry

## 2022-10-18 DIAGNOSIS — F17210 Nicotine dependence, cigarettes, uncomplicated: Secondary | ICD-10-CM

## 2022-10-18 DIAGNOSIS — L97521 Non-pressure chronic ulcer of other part of left foot limited to breakdown of skin: Secondary | ICD-10-CM | POA: Diagnosis not present

## 2022-10-18 DIAGNOSIS — T25322A Burn of third degree of left foot, initial encounter: Secondary | ICD-10-CM

## 2022-10-18 DIAGNOSIS — T25321A Burn of third degree of right foot, initial encounter: Secondary | ICD-10-CM

## 2022-10-18 DIAGNOSIS — L97512 Non-pressure chronic ulcer of other part of right foot with fat layer exposed: Secondary | ICD-10-CM | POA: Diagnosis not present

## 2022-10-18 MED ORDER — SILVER SULFADIAZINE 1 % EX CREA: 1.0000 | TOPICAL_CREAM | Freq: Every day | CUTANEOUS | 0 refills | Status: AC
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
## 2022-11-01 ENCOUNTER — Ambulatory Visit (INDEPENDENT_AMBULATORY_CARE_PROVIDER_SITE_OTHER): Payer: Medicaid Other | Admitting: Podiatry

## 2022-11-01 ENCOUNTER — Encounter: Payer: Self-pay | Admitting: Podiatry

## 2022-11-01 DIAGNOSIS — T25322D Burn of third degree of left foot, subsequent encounter: Secondary | ICD-10-CM

## 2022-11-01 DIAGNOSIS — L97521 Non-pressure chronic ulcer of other part of left foot limited to breakdown of skin: Secondary | ICD-10-CM

## 2022-11-01 DIAGNOSIS — L97512 Non-pressure chronic ulcer of other part of right foot with fat layer exposed: Secondary | ICD-10-CM | POA: Diagnosis not present

## 2022-11-01 DIAGNOSIS — T25321D Burn of third degree of right foot, subsequent encounter: Secondary | ICD-10-CM
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
## 2022-11-15 ENCOUNTER — Encounter: Payer: Self-pay | Admitting: Podiatry

## 2022-11-15 ENCOUNTER — Ambulatory Visit (INDEPENDENT_AMBULATORY_CARE_PROVIDER_SITE_OTHER): Payer: 59 | Admitting: Podiatry

## 2022-11-15 DIAGNOSIS — T25322D Burn of third degree of left foot, subsequent encounter: Secondary | ICD-10-CM

## 2022-11-15 DIAGNOSIS — T25321D Burn of third degree of right foot, subsequent encounter: Secondary | ICD-10-CM
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
## 2022-11-29 ENCOUNTER — Ambulatory Visit (INDEPENDENT_AMBULATORY_CARE_PROVIDER_SITE_OTHER): Payer: 59 | Admitting: Podiatry

## 2022-11-29 DIAGNOSIS — Z91199 Patient's noncompliance with other medical treatment and regimen due to unspecified reason: Secondary | ICD-10-CM

## 2022-11-29 NOTE — Progress Notes (Signed)
No show

## 2022-12-06 ENCOUNTER — Ambulatory Visit (INDEPENDENT_AMBULATORY_CARE_PROVIDER_SITE_OTHER): Payer: 59 | Admitting: Podiatry

## 2022-12-06 DIAGNOSIS — T25322D Burn of third degree of left foot, subsequent encounter: Secondary | ICD-10-CM

## 2022-12-06 DIAGNOSIS — T25321D Burn of third degree of right foot, subsequent encounter: Secondary | ICD-10-CM
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
## 2023-01-10 DIAGNOSIS — Z79899 Other long term (current) drug therapy: Secondary | ICD-10-CM | POA: Diagnosis not present

## 2023-05-23 IMAGING — MR MR MRA HEAD W/O CM
3 of 4 series · 35 of 48 positions shown · non-contrast
Comparison: Brain MRI 04/22/2021.

CLINICAL DATA: Neuro deficit, acute, stroke suspected. Additional
history provided: Double vision.

EXAM:
MRA HEAD WITHOUT CONTRAST
TECHNIQUE: Angiographic images of the Circle of Willis were acquired using MRA
technique without intravenous contrast.

[Series 14: tof_fl3d_tra_iso · axial · 0.6mm · 0.52mm/px · z∈[-204,-90]mm · 13 of 195 slices shown (1 of 3)]
[im 1/195]
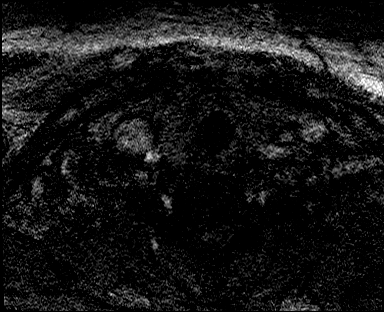
[im 17/195]
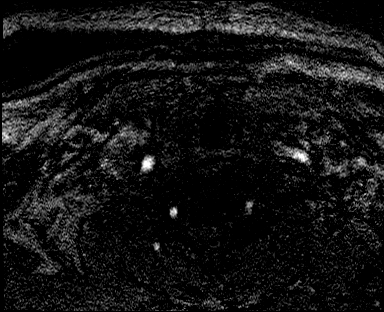
[im 33/195]
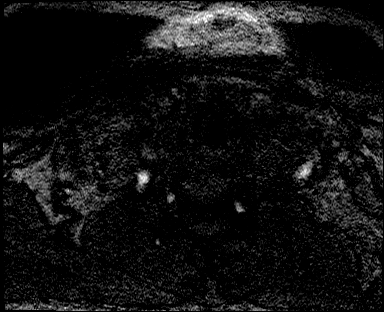
[im 49/195]
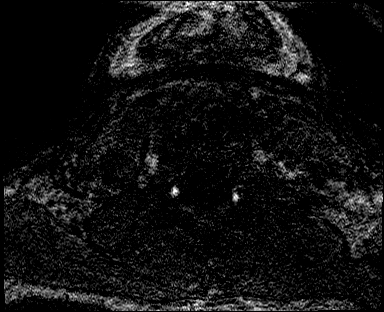
[im 65/195]
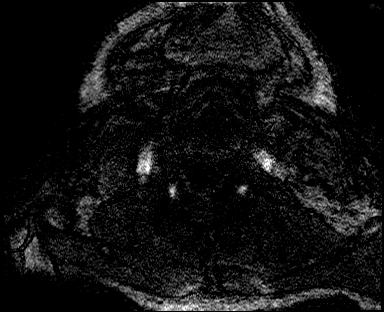
[im 81/195]
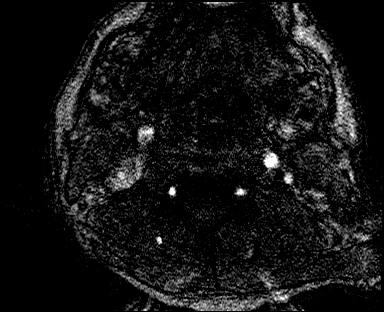
[im 98/195]
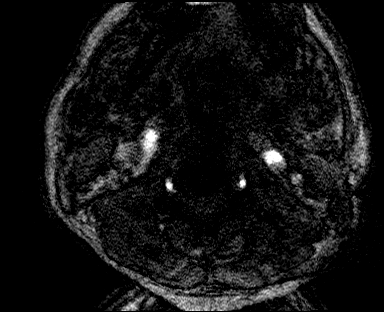
[im 114/195]
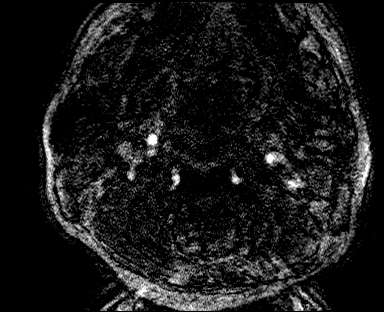
[im 130/195]
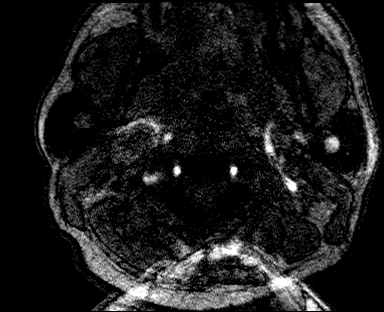
[im 146/195]
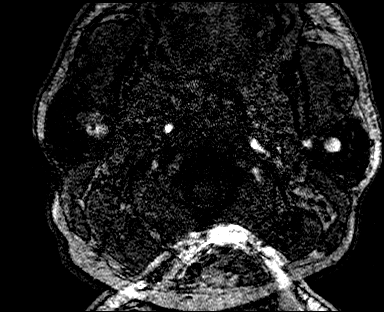
[im 162/195]
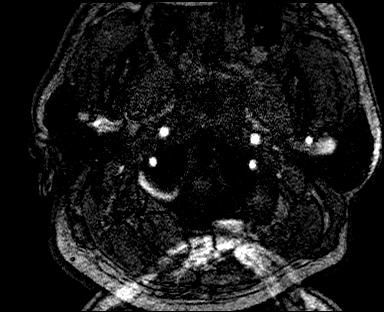
[im 178/195]
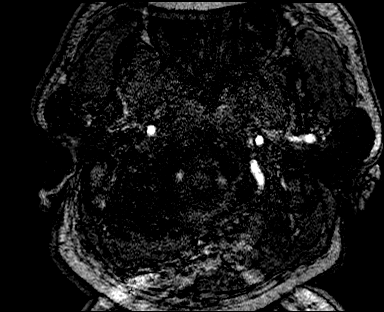
[im 195/195]
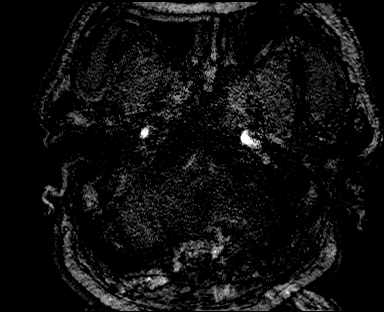

[Series 17: tof_fl3d_tra_iso · axial · 0.6mm · 0.52mm/px · z∈[-204,-90]mm · 13 of 195 slices shown (2 of 3)]
[im 1/195]
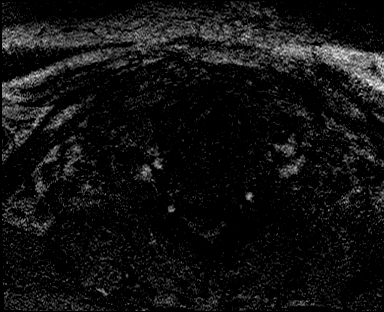
[im 17/195]
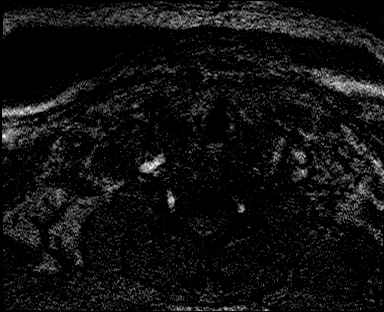
[im 33/195]
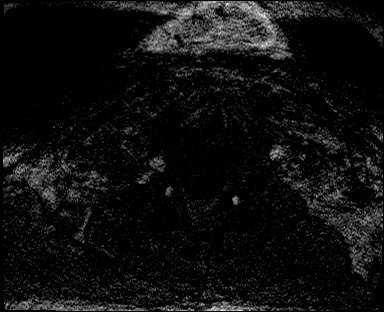
[im 49/195]
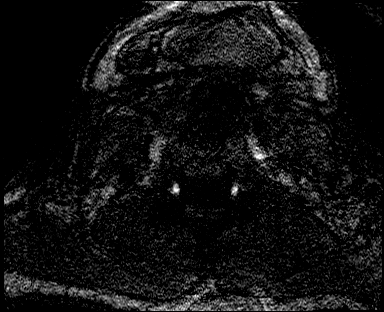
[im 65/195]
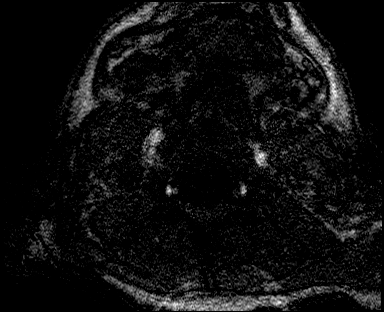
[im 81/195]
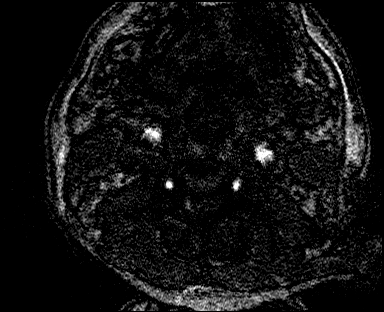
[im 98/195]
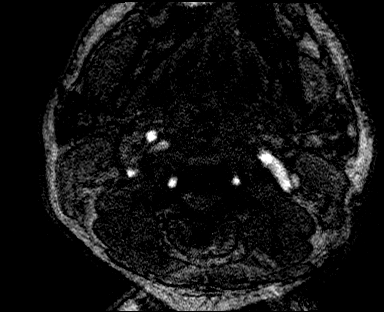
[im 114/195]
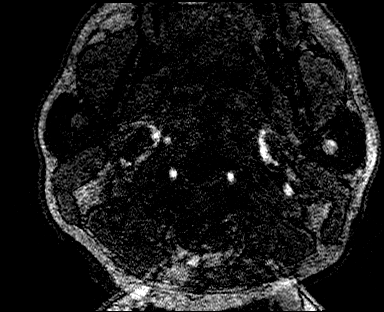
[im 130/195]
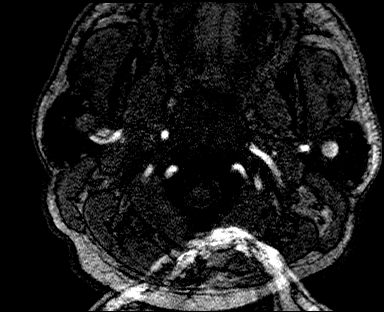
[im 146/195]
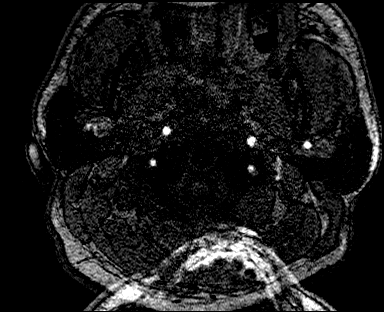
[im 162/195]
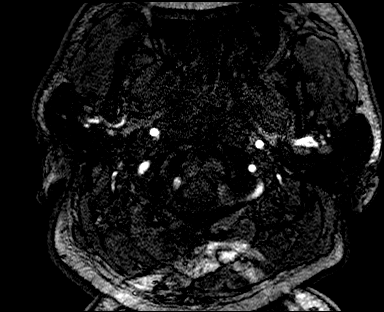
[im 178/195]
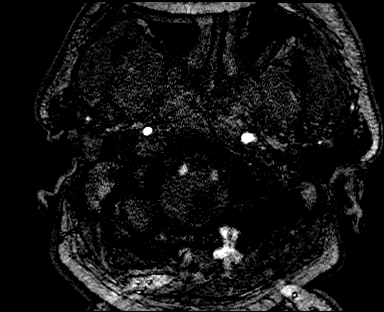
[im 195/195]
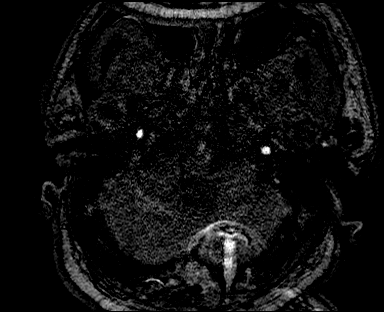

[Series 20: tof_fl3d_tra_iso · axial · 0.6mm · 0.52mm/px · z∈[-181,-105]mm · 9 of 133 slices shown (3 of 3)]
[im 1/133]
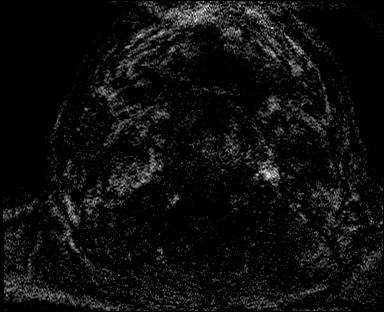
[im 17/133]
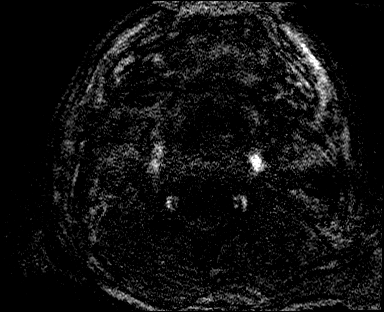
[im 34/133]
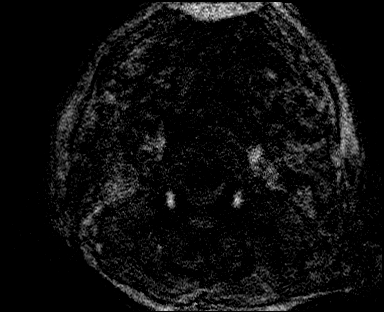
[im 50/133]
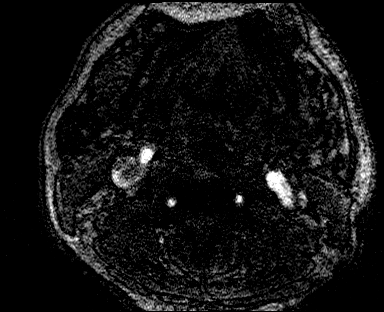
[im 67/133]
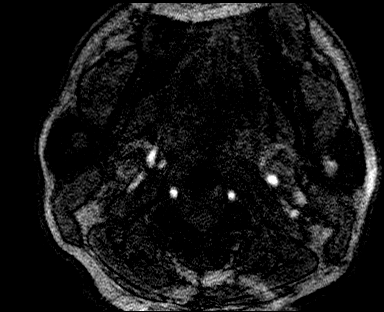
[im 83/133]
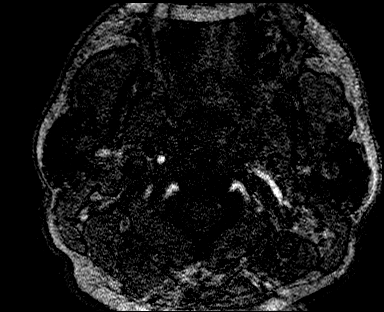
[im 100/133]
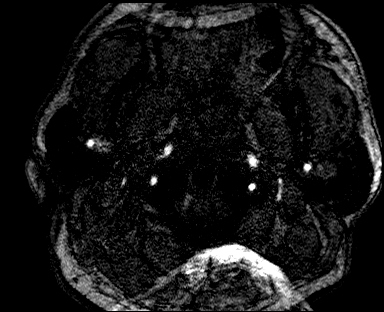
[im 116/133]
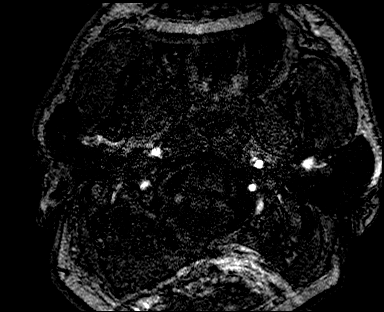
[im 133/133]
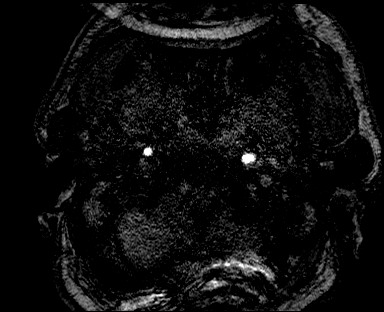

[35 of 48 positions shown; findings below may reference images not displayed]

FINDINGS: Mild to moderately motion degraded.

Anterior circulation:

The intracranial internal carotid arteries are patent. The M1 middle
cerebral arteries are patent. Mild atherosclerotic irregularity of
both intracranial internal carotid arteries and of the M1 right
middle cerebral artery. No M2 proximal branch occlusion or
high-grade proximal stenosis is identified. The anterior cerebral
arteries are patent. No intracranial aneurysm is identified.

Posterior circulation:

The intracranial vertebral arteries are patent. The basilar artery
is patent. The posterior cerebral arteries are patent. Mild stenosis
within the P3 segment of the right posterior cerebral artery.
Posterior communicating arteries are hypoplastic or absent
bilaterally.

Anatomic variants: As described.
IMPRESSION: Mild to moderately motion degraded exam.

No intracranial large vessel occlusion or proximal high-grade
arterial stenosis identified.

Mild atherosclerotic irregularity of the intracranial internal
carotid arteries, M1 right middle cerebral artery and right
posterior cerebral artery.
